# Patient Record
Sex: Male | Born: 1950
Health system: Southern US, Community
[De-identification: ages and names within clinical notes are randomized; demographics above are authoritative.]

## PROBLEM LIST (undated history)

## (undated) DIAGNOSIS — I10 Essential (primary) hypertension: Secondary | ICD-10-CM

## (undated) DIAGNOSIS — K635 Polyp of colon: Secondary | ICD-10-CM

## (undated) DIAGNOSIS — E119 Type 2 diabetes mellitus without complications: Secondary | ICD-10-CM

## (undated) DIAGNOSIS — I2510 Atherosclerotic heart disease of native coronary artery without angina pectoris: Secondary | ICD-10-CM

## (undated) DIAGNOSIS — I219 Acute myocardial infarction, unspecified: Secondary | ICD-10-CM

## (undated) DIAGNOSIS — N200 Calculus of kidney: Secondary | ICD-10-CM

## (undated) DIAGNOSIS — R6882 Decreased libido: Secondary | ICD-10-CM

## (undated) DIAGNOSIS — E291 Testicular hypofunction: Secondary | ICD-10-CM

## (undated) DIAGNOSIS — R1011 Right upper quadrant pain: Secondary | ICD-10-CM

## (undated) DIAGNOSIS — K5790 Diverticulosis of intestine, part unspecified, without perforation or abscess without bleeding: Secondary | ICD-10-CM

## (undated) DIAGNOSIS — N529 Male erectile dysfunction, unspecified: Secondary | ICD-10-CM

## (undated) DIAGNOSIS — Z87442 Personal history of urinary calculi: Secondary | ICD-10-CM

## (undated) DIAGNOSIS — R972 Elevated prostate specific antigen [PSA]: Secondary | ICD-10-CM

## (undated) HISTORY — DX: Diverticulosis of intestine, part unspecified, without perforation or abscess without bleeding: K57.90

## (undated) HISTORY — DX: Calculus of kidney: N20.0

## (undated) HISTORY — PX: APPENDECTOMY: SHX54

## (undated) HISTORY — DX: Polyp of colon: K63.5

## (undated) HISTORY — DX: Right upper quadrant pain: R10.11

## (undated) HISTORY — DX: Personal history of urinary calculi: Z87.442

## (undated) HISTORY — DX: Acute myocardial infarction, unspecified: I21.9

## (undated) HISTORY — DX: Elevated prostate specific antigen (PSA): R97.20

## (undated) HISTORY — PX: CYSTOSCOPY W/ URETEROSCOPY: SUR379

## (undated) HISTORY — PX: INGUINAL HERNIA REPAIR: SHX194

## (undated) HISTORY — DX: Decreased libido: R68.82

## (undated) HISTORY — DX: Male erectile dysfunction, unspecified: N52.9

## (undated) HISTORY — DX: Type 2 diabetes mellitus without complications: E11.9

## (undated) HISTORY — PX: CARDIAC CATHETERIZATION: SHX172

## (undated) HISTORY — DX: Testicular hypofunction: E29.1

## (undated) HISTORY — DX: Essential (primary) hypertension: I10

## (undated) HISTORY — PX: TONSILLECTOMY: SHX5217

---

## 1999-01-14 ENCOUNTER — Ambulatory Visit (HOSPITAL_BASED_OUTPATIENT_CLINIC_OR_DEPARTMENT_OTHER): Admission: RE | Admit: 1999-01-14 | Discharge: 1999-01-14 | Payer: Self-pay | Admitting: Surgery

## 2005-05-24 ENCOUNTER — Emergency Department (HOSPITAL_COMMUNITY): Admission: EM | Admit: 2005-05-24 | Discharge: 2005-05-24 | Payer: Self-pay | Admitting: Emergency Medicine

## 2005-06-03 ENCOUNTER — Ambulatory Visit (HOSPITAL_COMMUNITY): Admission: RE | Admit: 2005-06-03 | Discharge: 2005-06-03 | Payer: Self-pay | Admitting: Urology

## 2005-08-08 DIAGNOSIS — N200 Calculus of kidney: Secondary | ICD-10-CM

## 2005-08-08 HISTORY — PX: KIDNEY STONE SURGERY: SHX686

## 2005-08-08 HISTORY — DX: Calculus of kidney: N20.0

## 2012-03-14 ENCOUNTER — Ambulatory Visit (INDEPENDENT_AMBULATORY_CARE_PROVIDER_SITE_OTHER): Payer: 59 | Admitting: Family Medicine

## 2012-03-14 ENCOUNTER — Encounter: Payer: Self-pay | Admitting: Family Medicine

## 2012-03-14 VITALS — BP 160/94 | HR 60 | Temp 98.3°F | Ht 71.0 in | Wt 180.0 lb

## 2012-03-14 DIAGNOSIS — R030 Elevated blood-pressure reading, without diagnosis of hypertension: Secondary | ICD-10-CM

## 2012-03-14 DIAGNOSIS — K5900 Constipation, unspecified: Secondary | ICD-10-CM

## 2012-03-14 DIAGNOSIS — F17200 Nicotine dependence, unspecified, uncomplicated: Secondary | ICD-10-CM | POA: Insufficient documentation

## 2012-03-14 DIAGNOSIS — R1031 Right lower quadrant pain: Secondary | ICD-10-CM | POA: Insufficient documentation

## 2012-03-14 DIAGNOSIS — F17210 Nicotine dependence, cigarettes, uncomplicated: Secondary | ICD-10-CM | POA: Insufficient documentation

## 2012-03-14 DIAGNOSIS — IMO0001 Reserved for inherently not codable concepts without codable children: Secondary | ICD-10-CM

## 2012-03-14 NOTE — Progress Notes (Signed)
Chief Complaint  Patient presents with  . Advice Only    stomach pains infrequently over the past year or so, pain becoming more and more frequent lately.    HPI:  He complains of pain in his right lower abdomen, described as a sharp, knife-like pain.  Sometimes is sharp enough to double him over, other times is dull.  Sometimes pain radiates around to the right back.  Pain is sometimes alleivated by holding pressure over the area. He had an MRI done by urologist which showed small stones in the right kidney, nothing in the ureter, and that pain isn't from the urinary tract.  Stones have always been on the right side, and in similar location, but this was r/o'd as etiology.  Sometimes he is tender to touch over the right lower stomach, even just with his wife putting her arm there during sleep.  He has started working out regularly since January, qod, for an hour. Has no pain with exercise or with position changes. He is not having any pain/tenderness today. Pain is worse when his bowels are full, better after having bowel movement. No change with passing gas.  Started having some nausea with the pain in the last month, like he is very hungry.  Pain can go away for a few weeks at a time.  Used to only occur every few months, but has increased in the frequency.  Last colonoscopy was about 9-10 years ago, around 2003. He recalls they weren't able to complete the colonoscopy due a turn in the bowel they couldn't navigate around.  Also recalls being awake and having discomfort.  Stools used to be regular, daily. Since getting remarried, diet has changed a lot, sometimes going 2-3 days between bowel movements.  Sometimes bowels are soft and normal, other times is constipated and straining. Denies any blood in stool.  Past Medical History  Diagnosis Date  . Elevated PSA     recently biopsied, seeing Dr. Margarita Grizzle at [facility]  . Kidney stones 2007   Past Surgical History  Procedure Date  . Appendectomy    . Kidney stone surgery age 53  . Kidney stone surgery 2007    ?laser, possibly lithotripsy  . Tonsillectomy age 29  . Inguinal hernia repair     RIH   History   Social History  . Marital Status: Married    Spouse Name: N/A    Number of Children: N/A  . Years of Education: N/A   Occupational History  . Tree surgeon    Social History Main Topics  . Smoking status: Current Everyday Smoker -- 1.0 packs/day    Types: Cigarettes  . Smokeless tobacco: Never Used  . Alcohol Use: Yes     1 drink per day.   . Drug Use: No  . Sexually Active: Not on file   Other Topics Concern  . Not on file   Social History Narrative   Recently re-married   Family History  Problem Relation Age of Onset  . Stroke Mother 33  . Heart disease Mother 39  . Cancer Father 35    prostate  . Heart disease Father 82  . Hyperlipidemia Father   . Stroke Sister   . Cancer Maternal Uncle     lung cancer  . Cancer Maternal Aunt     breast cancer in mid-40's  . Cancer Paternal Uncle     prostate cancer  . Cancer Cousin     prostate cancer   Current outpatient prescriptions:Cranberry  500 MG CAPS, Take 1 capsule by mouth daily., Disp: , Rfl: ;  Ginkgo Biloba 40 MG TABS, Take 1 tablet by mouth daily., Disp: , Rfl: ;  Multiple Vitamins-Minerals (MULTIVITAMIN WITH MINERALS) tablet, Take 1 tablet by mouth daily., Disp: , Rfl: ;  Nutritional Supplements (DHEA PO), Take 1 tablet by mouth daily., Disp: , Rfl:  vardenafil (LEVITRA) 20 MG tablet, Take 20 mg by mouth daily as needed., Disp: , Rfl:   No Known Allergies  ROS:  Denies fevers, weight changes, headaches, dizziness, URI symptoms, shortness of breath, chest pain.  Denies heartburn, vomiting.  Bowel changes as above.  Denies urinary complaints, joint pains, skin rashes or other concerns.  See HPI  PHYSICAL EXAM: BP 142/84  Pulse 60  Temp 98.3 F (36.8 C) (Oral)  Ht 5\' 11"  (1.803 m)  Wt 180 lb (81.647 kg)  BMI 25.10 kg/m2 160/94 on repeat by  MD RA Well developed, well-appearing male in no distress HEENT:  Conjunctiva clear, PERRL, OP normal Neck: no lymphadenopathy, thyromegaly or mass Heart: regular rate and rhythm without murmur Lungs: clear bilaterally Back: no spine, SI or CVA tenderness Abdomen: soft, normal bowel sounds. Area of tenderness of right lower abdomen, somewhat laterally. No mass, not really tender.  No hepatosplenomegaly, negative Murphy sign.  No hernias Extremities: no edema Skin: no rash Neuro: alert and oriented, normal gait, cranial nerves grossly intact  ASSESSMENT/PLAN:  1. RLQ abdominal pain  Ambulatory referral to Gastroenterology  2. Tobacco use disorder    3. Elevated BP    4. Constipation     Increase fluid intake, fiber intake, stool softener. Refer to GI for repeat colonoscopy.  Elevated BP--Check BP elsewhere.  Low sodium diet reviewed (he is good at home, but not when eating without wife).  Quit smoking--set a quit date. Risks of smoking reviewed and encouraged cessation.  Return if having worsening abdominal pain, associated with nausea, vomiting, blood in stools, fevers or other concerns.

## 2012-03-14 NOTE — Patient Instructions (Addendum)
Check your BP periodically elsewhere--goal is <135/85. Follow a low sodium diet.  If BP remains elevated, we night need to discuss medications.  For now, work on diet, exercise, and monitoring blood pressure.  2 Gram Low Sodium Diet A 2 gram sodium diet restricts the amount of sodium in the diet to no more than 2 g or 2000 mg daily. Limiting the amount of sodium is often used to help lower blood pressure. It is important if you have heart, liver, or kidney problems. Many foods contain sodium for flavor and sometimes as a preservative. When the amount of sodium in a diet needs to be low, it is important to know what to look for when choosing foods and drinks. The following includes some information and guidelines to help make it easier for you to adapt to a low sodium diet. QUICK TIPS  Do not add salt to food.   Avoid convenience items and fast food.   Choose unsalted snack foods.   Buy lower sodium products, often labeled as "lower sodium" or "no salt added."   Check food labels to learn how much sodium is in 1 serving.   When eating at a restaurant, ask that your food be prepared with less salt or none, if possible.  READING FOOD LABELS FOR SODIUM INFORMATION The nutrition facts label is a good place to find how much sodium is in foods. Look for products with no more than 500 to 600 mg of sodium per meal and no more than 150 mg per serving. Remember that 2 g = 2000 mg. The food label may also list foods as:  Sodium-free: Less than 5 mg in a serving.   Very low sodium: 35 mg or less in a serving.   Low-sodium: 140 mg or less in a serving.   Light in sodium: 50% less sodium in a serving. For example, if a food that usually has 300 mg of sodium is changed to become light in sodium, it will have 150 mg of sodium.   Reduced sodium: 25% less sodium in a serving. For example, if a food that usually has 400 mg of sodium is changed to reduced sodium, it will have 300 mg of sodium.  CHOOSING  FOODS Grains  Avoid: Salted crackers and snack items. Some cereals, including instant hot cereals. Bread stuffing and biscuit mixes. Seasoned rice or pasta mixes.   Choose: Unsalted snack items. Low-sodium cereals, oats, puffed wheat and rice, shredded wheat. English muffins and bread. Pasta.  Meats  Avoid: Salted, canned, smoked, spiced, pickled meats, including fish and poultry. Bacon, ham, sausage, cold cuts, hot dogs, anchovies.   Choose: Low-sodium canned tuna and salmon. Fresh or frozen meat, poultry, and fish.  Dairy  Avoid: Processed cheese and spreads. Cottage cheese. Buttermilk and condensed milk. Regular cheese.   Choose: Milk. Low-sodium cottage cheese. Yogurt. Sour cream. Low-sodium cheese.  Fruits and Vegetables  Avoid: Regular canned vegetables. Regular canned tomato sauce and paste. Frozen vegetables in sauces. Olives. Rosita Fire. Relishes. Sauerkraut.   Choose: Low-sodium canned vegetables. Low-sodium tomato sauce and paste. Frozen or fresh vegetables. Fresh and frozen fruit.  Condiments  Avoid: Canned and packaged gravies. Worcestershire sauce. Tartar sauce. Barbecue sauce. Soy sauce. Steak sauce. Ketchup. Onion, garlic, and table salt. Meat flavorings and tenderizers.   Choose: Fresh and dried herbs and spices. Low-sodium varieties of mustard and ketchup. Lemon juice. Tabasco sauce. Horseradish.  SAMPLE 2 GRAM SODIUM MEAL PLAN Breakfast / Sodium (mg)  1 cup low-fat milk /  143 mg   2 slices whole-wheat toast / 270 mg   1 tbs heart-healthy margarine / 153 mg   1 hard-boiled egg / 139 mg   1 small orange / 0 mg  Lunch / Sodium (mg)  1 cup raw carrots / 76 mg    cup hummus / 298 mg   1 cup low-fat milk / 143 mg    cup red grapes / 2 mg   1 whole-wheat pita bread / 356 mg  Dinner / Sodium (mg)  1 cup whole-wheat pasta / 2 mg   1 cup low-sodium tomato sauce / 73 mg   3 oz lean ground beef / 57 mg   1 small side salad (1 cup raw spinach leaves,   cup cucumber,  cup yellow bell pepper) with 1 tsp olive oil and 1 tsp red wine vinegar / 25 mg  Snack / Sodium (mg)  1 container low-fat vanilla yogurt / 107 mg   3 graham cracker squares / 127 mg  Nutrient Analysis  Calories: 2033   Protein: 77 g   Carbohydrate: 282 g   Fat: 72 g   Sodium: 1971 mg  Document Released: 07/25/2005 Document Revised: 07/14/2011 Document Reviewed: 10/26/2009 [facility] Patient Information 2012 [place], [place].  Please QUIT SMOKING!  Increase fluid and fiber intake.  You may take a stool softener such as Colace on a daily basis to help keep stools soft--you can back off on taking this if they get too soft/loose.  We are referring you to GI for colonoscopy--please call us if you don't hear about an appointment with them in the next 2 weeks.

## 2012-03-15 ENCOUNTER — Encounter: Payer: Self-pay | Admitting: Gastroenterology

## 2012-04-10 ENCOUNTER — Encounter: Payer: Self-pay | Admitting: Gastroenterology

## 2012-04-10 ENCOUNTER — Ambulatory Visit (INDEPENDENT_AMBULATORY_CARE_PROVIDER_SITE_OTHER): Payer: 59 | Admitting: Gastroenterology

## 2012-04-10 ENCOUNTER — Other Ambulatory Visit (INDEPENDENT_AMBULATORY_CARE_PROVIDER_SITE_OTHER): Payer: 59

## 2012-04-10 VITALS — BP 154/92 | HR 68 | Ht 71.0 in | Wt 179.0 lb

## 2012-04-10 DIAGNOSIS — R1011 Right upper quadrant pain: Secondary | ICD-10-CM

## 2012-04-10 DIAGNOSIS — Z1211 Encounter for screening for malignant neoplasm of colon: Secondary | ICD-10-CM

## 2012-04-10 DIAGNOSIS — N200 Calculus of kidney: Secondary | ICD-10-CM

## 2012-04-10 LAB — VITAMIN B12: Vitamin B-12: 326 pg/mL (ref 211–911)

## 2012-04-10 LAB — FERRITIN: Ferritin: 272.9 ng/mL (ref 22.0–322.0)

## 2012-04-10 LAB — BASIC METABOLIC PANEL
Calcium: 9.1 mg/dL (ref 8.4–10.5)
Chloride: 104 mEq/L (ref 96–112)
Creatinine, Ser: 0.9 mg/dL (ref 0.4–1.5)
GFR: 89.29 mL/min (ref >60.00)
Glucose, Bld: 108 mg/dL — ABNORMAL HIGH (ref 70–99)
Potassium: 4.4 mEq/L (ref 3.5–5.1)
Sodium: 139 mEq/L (ref 135–145)

## 2012-04-10 LAB — HEPATIC FUNCTION PANEL
AST: 16 U/L (ref 0–37)
Albumin: 4 g/dL (ref 3.5–5.2)
Alkaline Phosphatase: 47 U/L (ref 39–117)
Bilirubin, Direct: 0.1 mg/dL (ref 0.0–0.3)
Total Bilirubin: 0.8 mg/dL (ref 0.3–1.2)

## 2012-04-10 LAB — CBC WITH DIFFERENTIAL/PLATELET
Basophils Absolute: 0.1 10*3/uL (ref 0.0–0.1)
Basophils Relative: 1.2 % (ref 0.0–3.0)
Eosinophils Absolute: 0.4 10*3/uL (ref 0.0–0.7)
HCT: 47.3 % (ref 39.0–52.0)
Hemoglobin: 15.9 g/dL (ref 13.0–17.0)
Lymphocytes Relative: 25.1 % (ref 12.0–46.0)
Lymphs Abs: 1.7 10*3/uL (ref 0.7–4.0)
MCHC: 33.7 g/dL (ref 30.0–36.0)
MCV: 93.4 fl (ref 78.0–100.0)
Monocytes Absolute: 0.6 10*3/uL (ref 0.1–1.0)
Monocytes Relative: 8.5 % (ref 3.0–12.0)
Neutro Abs: 3.9 10*3/uL (ref 1.4–7.7)
Neutrophils Relative %: 59.6 % (ref 43.0–77.0)
Platelets: 155 10*3/uL (ref 150.0–400.0)
RDW: 12.6 % (ref 11.5–14.6)
WBC: 6.6 10*3/uL (ref 4.5–10.5)

## 2012-04-10 LAB — IBC PANEL
Iron: 144 ug/dL (ref 42–165)
Saturation Ratios: 50 % (ref 20.0–50.0)
Transferrin: 205.8 mg/dL — ABNORMAL LOW (ref 212.0–360.0)

## 2012-04-10 LAB — TSH: TSH: 1.48 u[IU]/mL (ref 0.35–5.50)

## 2012-04-10 MED ORDER — HYOSCYAMINE SULFATE 0.125 MG SL SUBL: 0.1250 mg | SUBLINGUAL_TABLET | SUBLINGUAL | Status: DC | PRN

## 2012-04-10 MED ORDER — PEG-KCL-NACL-NASULF-NA ASC-C 100 G PO SOLR: 1.0000 | Freq: Once | ORAL | Status: DC

## 2012-04-10 NOTE — Patient Instructions (Addendum)
You have been scheduled for a colonoscopy with propofol. Please follow written instructions given to you at your visit today.  Please pick up your prep kit at the pharmacy within the next 1-3 days. If you use inhalers (even only as needed), please bring them with you on the day of your procedure. Your physician has requested that you go to the basement for the following lab work before leaving today: We have sent the following medications to your pharmacy for you to pick up at your convenience: You have been scheduled for an abdominal ultrasound at [facility] (1st floor of hospital) on 04-12-12 at 8:30am. Please arrive 15 minutes prior to your appointment for registration. Make certain not to have anything to eat or drink 6 hours prior to your appointment. Should you need to reschedule your appointment, please contact radiology at [phone].  CC:Eve Lynelle Doctor, M.D.

## 2012-04-10 NOTE — Progress Notes (Signed)
History of Present Illness:  This is a 61 year old Caucasian male referred by Dr.Knapp for evaluation of several months of rather persistent dull right upper quadrant abdominal pain with occasional sharp stabbing pain relieved by pressure and rest. He recently has had mild nausea but no emesis. The patient denies any hepatobiliary complaints or any history of hepatitis or pancreatitis, but he is a long-term steady user of cigarettes and alcohol. Past history is remarkable for multiple episodes of kidney stones, but apparently recent CT scan at his urologist showed no evidence of right sciatic kidney stones. Patient has fairly regular bowel movements and denies melena or hematochezia, or any history of anemia. There is a history of gallstones and multiple family members. The patient had a flexible sigmoidoscopy 10 years ago, but otherwise has not had endoscopic exams. He denies a specific food intolerances, and has recently voluntarily lost 5 the 10 pounds. He specific denies dyspepsia, reflux symptoms, dysphagia, or other upper GI problems. It is of note that he does use when necessary Advil rather regularly.  I have reviewed this patient's present history, medical and surgical past history, allergies and medications.     ROS: The remainder of the 10 point ROS is negative... recent prostate biopsy apparently was unremarkable. No history of current cardiovascular, pulmonary, or other urologic problems.     Physical Exam: Blood pressure 154/92, pulse 60 and regular, weight 179 pounds with a BMI of 24.97. 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 General well developed well nourished patient in no acute distress, appearing his stated age Eyes PERRLA, no icterus, fundoscopic exam per opthamologist Skin no lesions noted Neck supple, no adenopathy, no thyroid enlargement, no tenderness Chest clear to percussion and auscultation Heart no significant murmurs, gallops or rubs noted Abdomen no  hepatosplenomegaly masses or tenderness, BS normal.  Rectal inspection normal no fissures, or fistulae noted.  No masses or tenderness on digital exam. Stool guaiac negative. Extremities no acute joint lesions, edema, phlebitis or evidence of cellulitis. Neurologic patient oriented x 3, cranial nerves intact, no focal neurologic deficits noted. Psychological mental status normal and normal affect.  Assessment and plan: Atypical right upper quadrant pain in a 61 year old patient with a strong family history of cholelithiasis. Patient also has a history of chronic ethanol use, but I cannot appreciate hepatosplenomegaly. I have ordered liver function test for review, upper nominal ultrasound exam to exclude cholelithiasis, and also will perform colonoscopy because of his age. His pain may be musculoskeletal versus organic hepatobiliary disease.   Copy Dr.Knapp   No diagnosis found.

## 2012-04-11 ENCOUNTER — Other Ambulatory Visit: Payer: Self-pay | Admitting: Gastroenterology

## 2012-04-11 LAB — FOLATE: Folate: 11.1 ng/mL (ref >5.9)

## 2012-04-12 ENCOUNTER — Ambulatory Visit (HOSPITAL_COMMUNITY)
Admission: RE | Admit: 2012-04-12 | Discharge: 2012-04-12 | Disposition: A | Discharge status: Home / Self Care | Payer: 59 | Source: Ambulatory Visit | Attending: Gastroenterology | Admitting: Gastroenterology

## 2012-04-12 DIAGNOSIS — K824 Cholesterolosis of gallbladder: Secondary | ICD-10-CM | POA: Insufficient documentation

## 2012-04-12 DIAGNOSIS — R1011 Right upper quadrant pain: Secondary | ICD-10-CM

## 2012-04-13 ENCOUNTER — Other Ambulatory Visit: Payer: 59

## 2012-04-18 LAB — HEMOCHROMATOSIS DNA-PCR(C282Y,H63D): DNA Mutation Analysis: NOT DETECTED

## 2012-04-20 ENCOUNTER — Encounter: Payer: Self-pay | Admitting: Gastroenterology

## 2012-04-20 ENCOUNTER — Ambulatory Visit (AMBULATORY_SURGERY_CENTER): Payer: 59 | Admitting: Gastroenterology

## 2012-04-20 VITALS — BP 143/86 | HR 76 | Temp 98.2°F | Resp 33 | Ht 71.0 in | Wt 179.0 lb

## 2012-04-20 DIAGNOSIS — K5900 Constipation, unspecified: Secondary | ICD-10-CM

## 2012-04-20 DIAGNOSIS — D126 Benign neoplasm of colon, unspecified: Secondary | ICD-10-CM

## 2012-04-20 DIAGNOSIS — K5730 Diverticulosis of large intestine without perforation or abscess without bleeding: Secondary | ICD-10-CM

## 2012-04-20 DIAGNOSIS — R1011 Right upper quadrant pain: Secondary | ICD-10-CM

## 2012-04-20 LAB — HM COLONOSCOPY

## 2012-04-20 MED ORDER — SODIUM CHLORIDE 0.9 % IV SOLN: 500.0000 mL | INTRAVENOUS | Status: DC

## 2012-04-20 NOTE — Op Note (Signed)
[facility] [address] [place], [address]   COLONOSCOPY PROCEDURE REPORT  PATIENT: Adrian Baird, Adrian Baird  MR#: [id number] BIRTHDATE: [birth year] , 61  yrs. old GENDER: Male ENDOSCOPIST: Mardella Layman, MD, Clementeen Graham REFERRED BY:  Joselyn Arrow, M.D.  Devoria Albe, M.D. PROCEDURE DATE:  04/20/2012 PROCEDURE:   Colonoscopy, screening ASA CLASS:   Class II INDICATIONS:average risk patient for colon cancer. MEDICATIONS: propofol (Diprivan) 250mg  IV  DESCRIPTION OF PROCEDURE:   After the risks and benefits and of the procedure were explained, informed consent was obtained.  A digital rectal exam revealed no abnormalities of the rectum.    The LB [id number]  endoscope was introduced through the anus and advanced to the cecum, which was identified by both the appendix and ileocecal valve .  The quality of the prep was excellent, using MoviPrep .  The instrument was then slowly withdrawn as the colon was fully examined.     COLON FINDINGS: Mild diverticulosis was noted in the sigmoid colon. A diminutive smooth sessile polyp was found at the splenic flexure.  A polypectomy was performed with a cold snare.  The resection was complete and the polyp tissue was not retrieved.   A smooth sessile polyp ranging between 3-54mm in size was found in the rectum.  A polypectomy was performed using hot forceps.  The resection was complete and the polyp tissue was completely retrieved.     Retroflexed views revealed no abnormalities.     The scope was then withdrawn from the patient and the procedure completed.  COMPLICATIONS: There were no complications. ENDOSCOPIC IMPRESSION: 1.   Mild diverticulosis was noted in the sigmoid colon 2.   Diminutive sessile polyp was found at the splenic flexure; polypectomy was performed with a cold snare 3.   Sessile polyp ranging between 3-58mm in size was found in the rectum; polypectomy was performed using hot forceps  RECOMMENDATIONS: 1.   await pathology results 2.  Repeat colonoscopy in 5 years if polyp adenomatous; otherwise 10 years 3.  High fiber diet   REPEAT EXAM:  cc:  _______________________________ eSignedMardella Layman, MD, [facility] 04/20/2012 3:24 PM

## 2012-04-20 NOTE — Progress Notes (Signed)
Patient did not experience any of the following events: a burn prior to discharge; a fall within the facility; wrong site/side/patient/procedure/implant event; or a hospital transfer or hospital admission upon discharge from the facility. (G8907) Patient did not have preoperative order for IV antibiotic SSI prophylaxis. (G8918)  

## 2012-04-20 NOTE — Patient Instructions (Addendum)

## 2012-04-23 ENCOUNTER — Telehealth: Payer: Self-pay

## 2012-04-23 NOTE — Telephone Encounter (Signed)
  Follow up Call-  Call back number 04/20/2012  Post procedure Call Back phone  # [phone]  Permission to leave phone message Yes     Patient questions:  Do you have a fever, pain , or abdominal swelling? no Pain Score  0 *  Have you tolerated food without any problems? yes  Have you been able to return to your normal activities? yes  Do you have any questions about your discharge instructions: Diet   no Medications  no Follow up visit  no  Do you have questions or concerns about your Care? no  Actions: * If pain score is 4 or above: No action needed, pain <4.

## 2012-04-25 ENCOUNTER — Encounter: Payer: Self-pay | Admitting: Gastroenterology

## 2012-05-17 ENCOUNTER — Encounter: Payer: Self-pay | Admitting: *Deleted

## 2012-10-24 ENCOUNTER — Ambulatory Visit (INDEPENDENT_AMBULATORY_CARE_PROVIDER_SITE_OTHER): Payer: 59 | Admitting: Family Medicine

## 2012-10-24 ENCOUNTER — Encounter: Payer: Self-pay | Admitting: Family Medicine

## 2012-10-24 VITALS — BP 142/90 | HR 88 | Ht 71.0 in | Wt 186.0 lb

## 2012-10-24 DIAGNOSIS — F17200 Nicotine dependence, unspecified, uncomplicated: Secondary | ICD-10-CM

## 2012-10-24 MED ORDER — VARENICLINE TARTRATE 0.5 MG X 11 & 1 MG X 42 PO MISC: ORAL | Status: DC

## 2012-10-24 MED ORDER — VARENICLINE TARTRATE 1 MG PO TABS: 1.0000 mg | ORAL_TABLET | Freq: Two times a day (BID) | ORAL | Status: DC

## 2012-10-24 NOTE — Patient Instructions (Signed)
Smoking Cessation Quitting smoking is important to your health and has many advantages. However, it is not always easy to quit since nicotine is a very addictive drug. Often times, people try 3 times or more before being able to quit. This document explains the best ways for you to prepare to quit smoking. Quitting takes hard work and a lot of effort, but you can do it. ADVANTAGES OF QUITTING SMOKING  You will live longer, feel better, and live better.  Your body will feel the impact of quitting smoking almost immediately.  Within 20 minutes, blood pressure decreases. Your pulse returns to its normal level.  After 8 hours, carbon monoxide levels in the blood return to normal. Your oxygen level increases.  After 24 hours, the chance of having a heart attack starts to decrease. Your breath, hair, and body stop smelling like smoke.  After 48 hours, damaged nerve endings begin to recover. Your sense of taste and smell improve.  After 72 hours, the body is virtually free of nicotine. Your bronchial tubes relax and breathing becomes easier.  After 2 to 12 weeks, lungs can hold more air. Exercise becomes easier and circulation improves.  The risk of having a heart attack, stroke, cancer, or lung disease is greatly reduced.  After 1 year, the risk of coronary heart disease is cut in half.  After 5 years, the risk of stroke falls to the same as a nonsmoker.  After 10 years, the risk of lung cancer is cut in half and the risk of other cancers decreases significantly.  After 15 years, the risk of coronary heart disease drops, usually to the level of a nonsmoker.  If you are pregnant, quitting smoking will improve your chances of having a healthy baby.  The people you live with, especially any children, will be healthier.  You will have extra money to spend on things other than cigarettes. QUESTIONS TO THINK ABOUT BEFORE ATTEMPTING TO QUIT You may want to talk about your answers with your  caregiver.  Why do you want to quit?  If you tried to quit in the past, what helped and what did not?  What will be the most difficult situations for you after you quit? How will you plan to handle them?  Who can help you through the tough times? Your family? Friends? A caregiver?  What pleasures do you get from smoking? What ways can you still get pleasure if you quit? Here are some questions to ask your caregiver:  How can you help me to be successful at quitting?  What medicine do you think would be best for me and how should I take it?  What should I do if I need more help?  What is smoking withdrawal like? How can I get information on withdrawal? GET READY  Set a quit date.  Change your environment by getting rid of all cigarettes, ashtrays, matches, and lighters in your home, car, or work. Do not let people smoke in your home.  Review your past attempts to quit. Think about what worked and what did not. GET SUPPORT AND ENCOURAGEMENT You have a better chance of being successful if you have help. You can get support in many ways.  Tell your family, friends, and co-workers that you are going to quit and need their support. Ask them not to smoke around you.  Get individual, group, or telephone counseling and support. Programs are available at local hospitals and health centers. Call your local health department for   information about programs in your area.  Spiritual beliefs and practices may help some smokers quit.  Download a "quit meter" on your computer to keep track of quit statistics, such as how long you have gone without smoking, cigarettes not smoked, and money saved.  Get a self-help book about quitting smoking and staying off of tobacco. LEARN NEW SKILLS AND BEHAVIORS  Distract yourself from urges to smoke. Talk to someone, go for a walk, or occupy your time with a task.  Change your normal routine. Take a different route to work. Drink tea instead of coffee.  Eat breakfast in a different place.  Reduce your stress. Take a hot bath, exercise, or read a book.  Plan something enjoyable to do every day. Reward yourself for not smoking.  Explore interactive web-based programs that specialize in helping you quit. GET MEDICINE AND USE IT CORRECTLY Medicines can help you stop smoking and decrease the urge to smoke. Combining medicine with the above behavioral methods and support can greatly increase your chances of successfully quitting smoking.  Nicotine replacement therapy helps deliver nicotine to your body without the negative effects and risks of smoking. Nicotine replacement therapy includes nicotine gum, lozenges, inhalers, nasal sprays, and skin patches. Some may be available over-the-counter and others require a prescription.  Antidepressant medicine helps people abstain from smoking, but how this works is unknown. This medicine is available by prescription.  Nicotinic receptor partial agonist medicine simulates the effect of nicotine in your brain. This medicine is available by prescription. Ask your caregiver for advice about which medicines to use and how to use them based on your health history. Your caregiver will tell you what side effects to look out for if you choose to be on a medicine or therapy. Carefully read the information on the package. Do not use any other product containing nicotine while using a nicotine replacement product.  RELAPSE OR DIFFICULT SITUATIONS Most relapses occur within the first 3 months after quitting. Do not be discouraged if you start smoking again. Remember, most people try several times before finally quitting. You may have symptoms of withdrawal because your body is used to nicotine. You may crave cigarettes, be irritable, feel very hungry, cough often, get headaches, or have difficulty concentrating. The withdrawal symptoms are only temporary. They are strongest when you first quit, but they will go away within  10 14 days. To reduce the chances of relapse, try to:  Avoid drinking alcohol. Drinking lowers your chances of successfully quitting.  Reduce the amount of caffeine you consume. Once you quit smoking, the amount of caffeine in your body increases and can give you symptoms, such as a rapid heartbeat, sweating, and anxiety.  Avoid smokers because they can make you want to smoke.  Do not let weight gain distract you. Many smokers will gain weight when they quit, usually less than 10 pounds. Eat a healthy diet and stay active. You can always lose the weight gained after you quit.  Find ways to improve your mood other than smoking. FOR MORE INFORMATION  www.smokefree.gov  Document Released: 07/19/2001 Document Revised: 01/24/2012 Document Reviewed: 11/03/2011 [facility] Patient Information 2013 [place], [place].   Take the Chantix as prescribed.  Enroll in their support program. Stop if you develop significant side effects (nausea, or psychiatric, as we reviewed at visit). Start daily exercise routine. Keep healthy snacks around.

## 2012-10-24 NOTE — Progress Notes (Signed)
Chief Complaint  Patient presents with  . Nicotine Dependence    would like to use Chantix for smoking cessation. His wife used Chantix with good results.   Smoker:  He has quit for 5-10 years at a time in the past.  Last started smoking again about 2004.  Smoked 1PPD x 30 years total. In the past he quit cold [place] "just laid them down"--left pack on the counter for a year.  He has been unable to do that recently.  He has never tried Chantix in the past.  He got married in June. His wife restarted smoking after they married, and was able to quit with Chantix. He would like to try Chantix to aid in his efforts at smoking cessation.  Tried the e-cigarette--it seemed satisfactory, helped, but thought it was just a substitute, didn't like how the vapor felt in the lungs, so went back to cigarettes.  He never broke the hand-mouth habit.  He has been able to get down to 5/day, but not below that.  He finds that he really WANTS a cigarette with meals and coffee.  Past Medical History  Diagnosis Date  . Elevated PSA     recently biopsied, seeing Dr. Margarita Grizzle at [facility]  . Kidney stones 2007  . ED (erectile dysfunction)   . Hypogonadism male   . Decreased libido   . Personal history of urinary calculi   . Abdominal pain, left upper quadrant    Past Surgical History  Procedure Laterality Date  . Appendectomy    . Kidney stone surgery  2007    ?laser, possibly lithotripsy  . Kidney stone surgery  2007    ?laser, possibly lithotripsy  . Tonsillectomy  age 62  . Inguinal hernia repair      RI[facility]  . Cystoscopy w/ ureteroscopy      hx of   History   Social History  . Marital Status: Married    Spouse Name: N/A    Number of Children: 2  . Years of Education: N/A   Occupational History  . Tree surgeon    Social History Main Topics  . Smoking status: Current Every Day Smoker -- 1.00 packs/day    Types: Cigarettes  . Smokeless tobacco: Never Used     Comment: Trying to quit   .  Alcohol Use: 8.4 oz/week    7 Glasses of wine, 7 Cans of beer per week     Comment: 2 drink per day.   . Drug Use: No  . Sexually Active: Not on file   Other Topics Concern  . Not on file   Social History Narrative   Recently re-married   Daily caffeine    Current Outpatient Prescriptions on File Prior to Visit  Medication Sig Dispense Refill  . Cranberry 500 MG CAPS Take 1 capsule by mouth daily.      . Ginkgo Biloba 40 MG TABS Take 1 tablet by mouth daily.      . hyoscyamine (LEVSIN SL) 0.125 MG SL tablet Place 1 tablet (0.125 mg total) under the tongue every 4 (four) hours as needed for cramping.  30 tablet  11  . Multiple Vitamins-Minerals (MULTIVITAMIN WITH MINERALS) tablet Take 1 tablet by mouth daily.      . vardenafil (LEVITRA) 20 MG tablet Take 20 mg by mouth daily as needed.       No current facility-administered medications on file prior to visit.   No Known Allergies  ROS:  Denies fevers, URI symptoms.  Occasionally check BP's at home, also runs around 140/90.  Denies headaches, chest pain, dizziness. +ED.  Exercise has been limited some by his abdominal pain.  Needed to take levsin today after lunch for pain.  Overall doing a little better.  Had colonoscopy and saw Dr. Jarold Motto.  PHYSICAL EXAM: BP 142/90  Pulse 88  Ht 5\' 11"  (1.803 m)  Wt 186 lb (84.369 kg)  BMI 25.95 kg/m2 Well developed, pleasant male in no distress. Normal mood, affect, hygiene and grooming  ASSESSMENT/PLAN:  Tobacco use disorder - Plan: varenicline (CHANTIX STARTING MONTH PAK) 0.5 MG X 11 & 1 MG X 42 tablet, varenicline (CHANTIX CONTINUING MONTH PAK) 1 MG tablet  Counseled for 25-30 minutes regarding smoking cessation.  Reviewed risks of smoking, and which things may be reversible with quitting.  We discussed available counseling to help with his smoking cessation efforts.  He has gained weight when he quit in past--reviewed healthy snacks to have ready, as well as the recommendation to pair  with exercise (to give positive reinforcement about improvement in his breathing, and also prevent more weight gain).  Counseled extensively re: risks and side effects of Chantix, as well as other alternatives for smoking cessation (nicotine replacements, free counseling) in case he doesn't tolerate the medication.  Strongly encouraged to enroll in Chantix's support program.   F/u 4 months for CPE May need meds for blood pressure if not improved at CPE Low sodium diet, daily exercise

## 2013-02-15 ENCOUNTER — Encounter: Payer: Self-pay | Admitting: Internal Medicine

## 2013-02-27 ENCOUNTER — Encounter: Payer: Self-pay | Admitting: Family Medicine

## 2013-02-27 ENCOUNTER — Ambulatory Visit (INDEPENDENT_AMBULATORY_CARE_PROVIDER_SITE_OTHER): Payer: 59 | Admitting: Family Medicine

## 2013-02-27 ENCOUNTER — Telehealth: Payer: Self-pay | Admitting: *Deleted

## 2013-02-27 VITALS — BP 150/96 | HR 76 | Ht 71.0 in | Wt 191.0 lb

## 2013-02-27 DIAGNOSIS — Z23 Encounter for immunization: Secondary | ICD-10-CM

## 2013-02-27 DIAGNOSIS — R1031 Right lower quadrant pain: Secondary | ICD-10-CM

## 2013-02-27 DIAGNOSIS — Z1159 Encounter for screening for other viral diseases: Secondary | ICD-10-CM

## 2013-02-27 DIAGNOSIS — R030 Elevated blood-pressure reading, without diagnosis of hypertension: Secondary | ICD-10-CM

## 2013-02-27 DIAGNOSIS — Z13220 Encounter for screening for lipoid disorders: Secondary | ICD-10-CM

## 2013-02-27 DIAGNOSIS — Z0000 Encounter for general adult medical examination without abnormal findings: Secondary | ICD-10-CM

## 2013-02-27 DIAGNOSIS — IMO0001 Reserved for inherently not codable concepts without codable children: Secondary | ICD-10-CM | POA: Insufficient documentation

## 2013-02-27 LAB — POCT URINALYSIS DIPSTICK
Bilirubin, UA: NEGATIVE
Ketones, UA: NEGATIVE
Leukocytes, UA: NEGATIVE
Protein, UA: NEGATIVE

## 2013-02-27 NOTE — Telephone Encounter (Signed)
When I went in to give patient Tdap he had asked if you had ordered a CXR on him, stated that he briefly spoke with you about this. I told him I would send you a message and ask.

## 2013-02-27 NOTE — Telephone Encounter (Signed)
Patient states that he was just wanting to have one because he recently stopped smoking. He is not having any symptoms.

## 2013-02-27 NOTE — Telephone Encounter (Signed)
We did not discuss CXR at all during visit.  (maybe at a previous visit?)  They are not recommended for any routine screening for cancer. He wasn't having any symptoms

## 2013-02-27 NOTE — Progress Notes (Signed)
Chief Complaint  Patient presents with  . Annual Exam    fasting annual exam. Did want you to know that he is still having pain in his right side.     Adrian Baird is a 62 y.o. male who presents for a complete physical.  He has the following concerns:  Continues to have pain in R abdomen (mainly upper).  We spoke of this at his visit in 04/2012.  He has seen GI in interim.  He reports having had u/s.  He was told he had a "bend" in his colon which may be contributing, and to use metamucil.  Bowels are regular since using metamucil (previously had constipation), but pain unchanged.  He was also given rx for antispasmodic medication to use as needed, which is helpful. Needs it 3-5x/week.  Had some discomfort at 9am today, on empty stomach, after having had BM.  Sometimes he can feel a quivering in his upper R abdomen when lying at night.  Some stress related to marriage, and related to job changes, now a Production designer, theatre/television/film.  Sometimes has "bad days" where he feels down, related to stress. Denies significant depression, thoughts of hurting self, frequent crying.  Elevated BP's--Has been checking BP's elsewhere, and never has it been <140/90.  Typically running 148/92.  This has been since he stopped smoking, had been even higher while still smoking.  Eats some fast food ([employer]), and has french fries. Had [place] yesterday, +chips.  His wife cooks healthy at home, but that is only 1 meal/day  He quit smoking with the use of Chantix--took it for 2.5 months. Doing very well, even in situations where others smoke around him (ie golfing)  He sees urology yearly, last seen June. Had blood, urine, GU and rectal exam.  There is no immunization history on file for this patient. Gets flu shots through work He denies ever having had chicken pox, although had exposure as a child Last Td/TdaP unknown, likely >10 years Last colonoscopy: 04/2012 Dr. Jarold Motto Last PSA: 01/2013 by urologist Dentist: yearly Ophtho: every  year Exercise:  Golf weekly.  Hasn't otherwise been getting exercise regularly recently.  Has a gym at work  Past Medical History  Diagnosis Date  . Elevated PSA     recently biopsied, seeing Dr. Margarita Grizzle at [facility]  . Kidney stones 2007  . ED (erectile dysfunction)   . Hypogonadism male   . Decreased libido   . Personal history of urinary calculi   . RUQ abdominal pain     Past Surgical History  Procedure Laterality Date  . Appendectomy    . Kidney stone surgery  2007    ?laser, possibly lithotripsy  . Kidney stone surgery  2007    ?laser, possibly lithotripsy  . Tonsillectomy  age 89  . Inguinal hernia repair      RIH  . Cystoscopy w/ ureteroscopy      hx of    History   Social History  . Marital Status: Married    Spouse Name: N/A    Number of Children: 2  . Years of Education: N/A   Occupational History  . Tree surgeon, Production designer, theatre/television/film    Social History Main Topics  . Smoking status: Former Smoker -- 1.00 packs/day    Types: Cigarettes    Quit date: 11/02/2012  . Smokeless tobacco: Never Used     Comment: Trying to quit   . Alcohol Use: 8.4 oz/week    7 Glasses of wine, 7 Cans of beer  per week     Comment: 1-2 drinks per day  . Drug Use: No  . Sexually Active: Not on file   Other Topics Concern  . Not on file   Social History Narrative   re-married. 2 children--daughter in [facility], son in [place]    Family History  Problem Relation Age of Onset  . Stroke Mother 3  . Heart disease Mother 75  . Prostate cancer Father 65  . Heart disease Father 79  . Hyperlipidemia Father   . Stroke Sister   . Lung cancer Maternal Uncle   . Breast cancer Maternal Aunt     mid-40's  . Prostate cancer Paternal Uncle   . Prostate cancer Cousin   . Colon cancer Neg Hx     Current outpatient prescriptions:Ginkgo Biloba 40 MG TABS, Take 1 tablet by mouth daily., Disp: , Rfl: ;  hyoscyamine (LEVSIN SL) 0.125 MG SL tablet, Place 1 tablet (0.125 mg total) under the tongue every  4 (four) hours as needed for cramping., Disp: 30 tablet, Rfl: 11;  Multiple Vitamins-Minerals (MULTIVITAMIN WITH MINERALS) tablet, Take 1 tablet by mouth daily., Disp: , Rfl:  tadalafil (CIALIS) 5 MG tablet, Take 5 mg by mouth daily as needed for erectile dysfunction., Disp: , Rfl: ;  tamsulosin (FLOMAX) 0.4 MG CAPS, Take 0.4 mg by mouth daily. , Disp: , Rfl: ;  vardenafil (LEVITRA) 20 MG tablet, Take 20 mg by mouth daily as needed., Disp: , Rfl:   No Known Allergies  ROS:  The patient denies anorexia, fever, headaches,  vision loss, decreased hearing, ear pain, hoarseness, chest pain, palpitations, dizziness, syncope, dyspnea on exertion, cough, swelling, nausea, vomiting, diarrhea, constipation, melena, hematochezia, indigestion/heartburn, hematuria, incontinence, dysuria, genital lesions, joint pains, numbness, tingling, weakness, tremor, suspicious skin lesions, anxiety, abnormal bleeding/bruising, or enlarged lymph nodes 5# weight gain since he quit smoking. Weakened stream improved since on Flomax.  +ED, controlled with meds.   PHYSICAL EXAM:  BP 150/96  Pulse 76  Ht 5\' 11"  (1.803 m)  Wt 191 lb (86.637 kg)  BMI 26.65 kg/m2  General Appearance:    Alert, cooperative, no distress, appears stated age  Head:    Normocephalic, without obvious abnormality, atraumatic  Eyes:    PERRL, conjunctiva/corneas clear, EOM's intact, fundi    benign  Ears:    Normal TM's and external ear canals  Nose:   Nares normal, mucosa normal, no drainage or sinus   tenderness  Throat:   Lips, mucosa, and tongue normal; visualized gums normal; upper dentures and partial lowers  Neck:   Supple, no lymphadenopathy;  thyroid:  no   enlargement/tenderness/nodules; no carotid   bruit or JVD  Back:    Spine nontender, no curvature, ROM normal, no CVA     tenderness  Lungs:     Clear to auscultation bilaterally without wheezes, rales or     ronchi; respirations unlabored  Chest Wall:    No tenderness or deformity    Heart:    Regular rate and rhythm, S1 and S2 normal, no murmur, rub   or gallop  Breast Exam:    No chest wall tenderness, masses or gynecomastia  Abdomen:     Soft, non-tender, nondistended, normoactive bowel sounds,    no masses, no hepatosplenomegaly  Genitalia:    Deferred to urologist.  Rectal:    Deferred due to urologist.  Extremities:   No clubbing, cyanosis or edema  Pulses:   2+ and symmetric all extremities  Skin:  Skin color, texture, turgor normal, no rashes or lesions  Lymph nodes:   Cervical, supraclavicular, and axillary nodes normal  Neurologic:   CNII-XII intact, normal strength, sensation and gait; reflexes 2+ and symmetric throughout          Psych:   Normal mood, affect, hygiene and grooming.    ASSESSMENT/PLAN: Routine general medical examination at a health care facility - Plan: Visual acuity screening, POCT Urinalysis Dipstick, Comprehensive metabolic panel, Lipid panel  Need for Tdap vaccination - Plan: Tdap vaccine greater than or equal to 7yo IM  RLQ abdominal pain  Elevated BP - Plan: Comprehensive metabolic panel  Screening for lipoid disorders - Plan: Lipid panel  Screening for viral disease - Plan: Varicella zoster antibody, IgG  Discussed exercising at least 30 minutes of aerobic activity at least 5 days/week; proper sunscreen use reviewed; healthy diet and alcohol recommendations (less than or equal to 2 drinks/day) reviewed; regular seatbelt use; changing batteries in smoke detectors.  Immunization recommendations discussed--see below.  Colonoscopy recommendations reviewed, UTD  If +varicella IgG, can return in a month for zostavax (if covered, desired).  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  Can call here for refills when needed, continue prn.  Elevated BP's.  Low sodium diet reviewed, daily exercise  encouraged.  Continue to monitor BP's.  If still >140/90 in 3 months, then return for visit, and bring list of BP's

## 2013-02-27 NOTE — Telephone Encounter (Signed)
Patient informed. 

## 2013-02-27 NOTE — Patient Instructions (Addendum)
HEALTH MAINTENANCE RECOMMENDATIONS:  It is recommended that you get at least 30 minutes of aerobic exercise at least 5 days/week (for weight loss, you may need as much as 60-90 minutes). This can be any activity that gets your heart rate up. This can be divided in 10-15 minute intervals if needed, but try and build up your endurance at least once a week.  Weight bearing exercise is also recommended twice weekly.  Eat a healthy diet with lots of vegetables, fruits and fiber.  "Colorful" foods have a lot of vitamins (ie green vegetables, tomatoes, red peppers, etc).  Limit sweet tea, regular sodas and alcoholic beverages, all of which has a lot of calories and sugar.  Up to 2 alcoholic drinks daily may be beneficial for men (unless trying to lose weight, watch sugars).  Drink a lot of water.  Sunscreen of at least SPF 30 should be used on all sun-exposed parts of the skin when outside between the hours of 10 am and 4 pm (not just when at beach or pool, but even with exercise, golf, tennis, and yard work!)  Use a sunscreen that says "broad spectrum" so it covers both UVA and UVB rays, and make sure to reapply every 1-2 hours.  Remember to change the batteries in your smoke detectors when changing your clock times in the spring and fall.  Use your seat belt every time you are in a car, and please drive safely and not be distracted with cell phones and texting while driving.  Low sodium diet, daily exercise.  Continue to monitor BP's.  If still >140/90 in 3 months, then return for visit, and bring list of BP's  2 Gram Low Sodium Diet A 2 gram sodium diet restricts the amount of sodium in the diet to no more than 2 g or 2000 mg daily. Limiting the amount of sodium is often used to help lower blood pressure. It is important if you have heart, liver, or kidney problems. Many foods contain sodium for flavor and sometimes as a preservative. When the amount of sodium in a diet needs to be low, it is important to  know what to look for when choosing foods and drinks. The following includes some information and guidelines to help make it easier for you to adapt to a low sodium diet. QUICK TIPS  Do not add salt to food.  Avoid convenience items and fast food.  Choose unsalted snack foods.  Buy lower sodium products, often labeled as "lower sodium" or "no salt added."  Check food labels to learn how much sodium is in 1 serving.  When eating at a restaurant, ask that your food be prepared with less salt or none, if possible. READING FOOD LABELS FOR SODIUM INFORMATION The nutrition facts label is a good place to find how much sodium is in foods. Look for products with no more than 500 to 600 mg of sodium per meal and no more than 150 mg per serving. Remember that 2 g = 2000 mg. The food label may also list foods as:  Sodium-free: Less than 5 mg in a serving.  Very low sodium: 35 mg or less in a serving.  Low-sodium: 140 mg or less in a serving.  Light in sodium: 50% less sodium in a serving. For example, if a food that usually has 300 mg of sodium is changed to become light in sodium, it will have 150 mg of sodium.  Reduced sodium: 25% less sodium in a serving.  For example, if a food that usually has 400 mg of sodium is changed to reduced sodium, it will have 300 mg of sodium. CHOOSING FOODS Grains  Avoid: Salted crackers and snack items. Some cereals, including instant hot cereals. Bread stuffing and biscuit mixes. Seasoned rice or pasta mixes.  Choose: Unsalted snack items. Low-sodium cereals, oats, puffed wheat and rice, shredded wheat. English muffins and bread. Pasta. Meats  Avoid: Salted, canned, smoked, spiced, pickled meats, including fish and poultry. Bacon, ham, sausage, cold cuts, hot dogs, anchovies.  Choose: Low-sodium canned tuna and salmon. Fresh or frozen meat, poultry, and fish. Dairy  Avoid: Processed cheese and spreads. Cottage cheese. Buttermilk and condensed milk.  Regular cheese.  Choose: Milk. Low-sodium cottage cheese. Yogurt. Sour cream. Low-sodium cheese. Fruits and Vegetables  Avoid: Regular canned vegetables. Regular canned tomato sauce and paste. Frozen vegetables in sauces. Olives. Rosita Fire. Relishes. Sauerkraut.  Choose: Low-sodium canned vegetables. Low-sodium tomato sauce and paste. Frozen or fresh vegetables. Fresh and frozen fruit. Condiments  Avoid: Canned and packaged gravies. Worcestershire sauce. Tartar sauce. Barbecue sauce. Soy sauce. Steak sauce. Ketchup. Onion, garlic, and table salt. Meat flavorings and tenderizers.  Choose: Fresh and dried herbs and spices. Low-sodium varieties of mustard and ketchup. Lemon juice. Tabasco sauce. Horseradish. SAMPLE 2 GRAM SODIUM MEAL PLAN Breakfast / Sodium (mg)  1 cup low-fat milk / 143 mg  2 slices whole-wheat toast / 270 mg  1 tbs heart-healthy margarine / 153 mg  1 hard-boiled egg / 139 mg  1 small orange / 0 mg Lunch / Sodium (mg)  1 cup raw carrots / 76 mg   cup hummus / 298 mg  1 cup low-fat milk / 143 mg   cup red grapes / 2 mg  1 whole-wheat pita bread / 356 mg Dinner / Sodium (mg)  1 cup whole-wheat pasta / 2 mg  1 cup low-sodium tomato sauce / 73 mg  3 oz lean ground beef / 57 mg  1 small side salad (1 cup raw spinach leaves,  cup cucumber,  cup yellow bell pepper) with 1 tsp olive oil and 1 tsp red wine vinegar / 25 mg Snack / Sodium (mg)  1 container low-fat vanilla yogurt / 107 mg  3 graham cracker squares / 127 mg Nutrient Analysis  Calories: 2033  Protein: 77 g  Carbohydrate: 282 g  Fat: 72 g  Sodium: 1971 mg Document Released: 07/25/2005 Document Revised: 10/17/2011 Document Reviewed: 10/26/2009 Exit[employer] Patient Information 2014 [place], [place].  Check to see if shingles vaccine is covered

## 2013-02-27 NOTE — Telephone Encounter (Signed)
This is not indicated nor recommended.

## 2013-02-28 ENCOUNTER — Encounter: Payer: Self-pay | Admitting: Family Medicine

## 2013-02-28 LAB — COMPREHENSIVE METABOLIC PANEL
ALT: 32 U/L (ref 0–53)
AST: 22 U/L (ref 0–37)
Calcium: 9.6 mg/dL (ref 8.4–10.5)
Chloride: 102 mEq/L (ref 96–112)
Creat: 0.96 mg/dL (ref 0.50–1.35)
Total Bilirubin: 0.6 mg/dL (ref 0.3–1.2)

## 2013-02-28 LAB — LIPID PANEL
LDL Cholesterol: 101 mg/dL — ABNORMAL HIGH (ref 0–99)
Total CHOL/HDL Ratio: 2.9 Ratio
VLDL: 16 mg/dL (ref 0–40)

## 2013-02-28 LAB — VARICELLA ZOSTER ANTIBODY, IGG: Varicella IgG: 2772 Index — ABNORMAL HIGH (ref <135.00)

## 2013-03-04 ENCOUNTER — Telehealth: Payer: Self-pay | Admitting: *Deleted

## 2013-03-04 NOTE — Telephone Encounter (Signed)
Left message for patient informing that the chest ct that he is interested in possibly having done, screening for smoker is $299 and insurance will NOT pay for this screening.

## 2013-04-01 ENCOUNTER — Encounter: Payer: Self-pay | Admitting: Family Medicine

## 2013-06-13 ENCOUNTER — Other Ambulatory Visit: Payer: Self-pay

## 2013-07-15 ENCOUNTER — Other Ambulatory Visit: Payer: Self-pay | Admitting: Gastroenterology

## 2013-07-15 NOTE — Telephone Encounter (Signed)
PLEASE MAKE AN APPOINTMENT FOR FURTHER REFILLS  

## 2013-12-14 ENCOUNTER — Other Ambulatory Visit: Payer: Self-pay | Admitting: Gastroenterology

## 2013-12-16 ENCOUNTER — Other Ambulatory Visit: Payer: Self-pay | Admitting: Gastroenterology

## 2014-05-23 ENCOUNTER — Other Ambulatory Visit: Payer: Self-pay

## 2014-06-09 ENCOUNTER — Telehealth: Payer: Self-pay | Admitting: Gastroenterology

## 2014-06-09 NOTE — Telephone Encounter (Signed)
Spoke with patient's wife and he is having left sided abdominal pain. Episodes are increasing and his Hyoscyamine is not helping like it used too. Scheduled with Lori Hvozdovic, PA-C on 06/11/14 at 9:45 AM.

## 2014-06-11 ENCOUNTER — Other Ambulatory Visit (INDEPENDENT_AMBULATORY_CARE_PROVIDER_SITE_OTHER): Payer: 59

## 2014-06-11 ENCOUNTER — Ambulatory Visit: Payer: 59 | Admitting: Physician Assistant

## 2014-06-11 ENCOUNTER — Encounter: Payer: Self-pay | Admitting: *Deleted

## 2014-06-11 ENCOUNTER — Ambulatory Visit (INDEPENDENT_AMBULATORY_CARE_PROVIDER_SITE_OTHER): Payer: 59 | Admitting: Internal Medicine

## 2014-06-11 VITALS — BP 142/86 | HR 84

## 2014-06-11 DIAGNOSIS — R109 Unspecified abdominal pain: Secondary | ICD-10-CM

## 2014-06-11 DIAGNOSIS — K824 Cholesterolosis of gallbladder: Secondary | ICD-10-CM

## 2014-06-11 LAB — COMPREHENSIVE METABOLIC PANEL
ALT: 38 U/L (ref 0–53)
AST: 26 U/L (ref 0–37)
Albumin: 3.7 g/dL (ref 3.5–5.2)
Alkaline Phosphatase: 50 U/L (ref 39–117)
BUN: 16 mg/dL (ref 6–23)
CALCIUM: 9.3 mg/dL (ref 8.4–10.5)
CHLORIDE: 105 meq/L (ref 96–112)
CO2: 23 meq/L (ref 19–32)
CREATININE: 1 mg/dL (ref 0.4–1.5)
GFR: 80.52 mL/min (ref >60.00)
Glucose, Bld: 125 mg/dL — ABNORMAL HIGH (ref 70–99)
Potassium: 4.2 mEq/L (ref 3.5–5.1)
Sodium: 138 mEq/L (ref 135–145)
Total Bilirubin: 0.8 mg/dL (ref 0.2–1.2)
Total Protein: 7.1 g/dL (ref 6.0–8.3)

## 2014-06-11 LAB — CBC WITH DIFFERENTIAL/PLATELET
BASOS ABS: 0.1 10*3/uL (ref 0.0–0.1)
Basophils Relative: 1.8 % (ref 0.0–3.0)
EOS PCT: 5.2 % — AB (ref 0.0–5.0)
Eosinophils Absolute: 0.4 10*3/uL (ref 0.0–0.7)
HCT: 46.3 % (ref 39.0–52.0)
Hemoglobin: 15.5 g/dL (ref 13.0–17.0)
Lymphocytes Relative: 25.6 % (ref 12.0–46.0)
Lymphs Abs: 2.1 10*3/uL (ref 0.7–4.0)
MCHC: 33.4 g/dL (ref 30.0–36.0)
MCV: 92.7 fl (ref 78.0–100.0)
MONO ABS: 0.7 10*3/uL (ref 0.1–1.0)
Monocytes Relative: 8.3 % (ref 3.0–12.0)
NEUTROS PCT: 59.1 % (ref 43.0–77.0)
Neutro Abs: 4.8 10*3/uL (ref 1.4–7.7)
Platelets: 186 10*3/uL (ref 150.0–400.0)
RBC: 4.99 Mil/uL (ref 4.22–5.81)
RDW: 12.8 % (ref 11.5–15.5)
WBC: 8.1 10*3/uL (ref 4.0–10.5)

## 2014-06-11 LAB — URINALYSIS, ROUTINE W REFLEX MICROSCOPIC
Bilirubin Urine: NEGATIVE
HGB URINE DIPSTICK: NEGATIVE
Ketones, ur: NEGATIVE
LEUKOCYTES UA: NEGATIVE
NITRITE: NEGATIVE
Specific Gravity, Urine: 1.01 (ref 1.000–1.030)
TOTAL PROTEIN, URINE-UPE24: NEGATIVE
Urine Glucose: NEGATIVE
Urobilinogen, UA: 0.2 (ref 0.0–1.0)
pH: 7.5 (ref 5.0–8.0)

## 2014-06-11 LAB — LIPASE: LIPASE: 25 U/L (ref 11.0–59.0)

## 2014-06-11 LAB — AMYLASE: Amylase: 27 U/L (ref 27–131)

## 2014-06-11 MED ORDER — HYOSCYAMINE SULFATE ER 0.375 MG PO TB12: 0.3750 mg | ORAL_TABLET | ORAL | Status: DC

## 2014-06-11 NOTE — Progress Notes (Signed)
Adrian Baird [birth year] [id number]  Note: This dictation was prepared with Dragon digital system. Any transcriptional errors that result from this procedure are unintentional.   History of Present Illness:  This is a 63 year old white male, former patient of Dr. Sharlett Iles who has had at least 6 months history of right middle quadrant abdominal pain. He was evaluated for the same pain in 2013 but no definite cause was found. He takes Levsin sublingually 0.125 mg which seems to relieve the pain most of the time. He describes the pain as constant. It sometimes radiates to the back. It is not associated with physical activity. It has recently awakened him at night. It is not related to food or bowel movements. He had a colonoscopy in September 2013 with findings of mild diverticulosis and a hyperplastic polyp. He has a small gallbladder polyp on an ultrasound from September 2013. There is a strong family history of gallbladder disease in several direct relatives. He denies jaundice or fever. He has a history of alcohol abuse. He also has history of kidney stones. His weight has been stable. He has been trying to lose weight.    Past Medical History  Diagnosis Date  . Elevated PSA     recently biopsied, seeing Dr. Jasmine December at [facility]  . Kidney stones 2007  . ED (erectile dysfunction)   . Hypogonadism male   . Decreased libido   . Personal history of urinary calculi   . RUQ abdominal pain   . Diverticulosis   . Hyperplastic colon polyp     Past Surgical History  Procedure Laterality Date  . Appendectomy    . Kidney stone surgery  2007    ?laser, possibly lithotripsy  . Kidney stone surgery  2007    ?laser, possibly lithotripsy  . Tonsillectomy  age 74  . Inguinal hernia repair      RIH  . Cystoscopy w/ ureteroscopy      hx of    No Known Allergies  Family history and social history have been reviewed.  Review of Systems: negative for fever nausea vomiting weight loss  The  remainder of the 10 point ROS is negative except as outlined in the H&P  Physical Exam: General Appearance Well developed, in no distress Eyes  Non icteric  HEENT  Non traumatic, normocephalic  Mouth No lesion, tongue papillated, no cheilosis Neck Supple without adenopathy, thyroid not enlarged, no carotid bruits, no JVD Lungs Clear to auscultation bilaterally COR Normal S1, normal S2, regular rhythm, no murmur, quiet precordium Abdomen soft. Post appendectomy scar and post renal calculi scar in right lateral abdomen, tenderness in right middle and lower quadrant. No palpable mass. No obvious hernia. Straight take raising is negative. No CVA tenderness. Liver edge at costal margin. Rectal soft formed Hemoccult negative stool. Extremities  No pedal edema Skin No lesions Neurological Alert and oriented x 3 Psychological Normal mood and affect  Assessment and Plan:   Problem #53 63 year old white male with a recurrence of right-sided abdominal pain which seems to be relieved with antispasmodics. The pain has become more severe and is no longer responding to antispasmodics. He is up-to-date on his colonoscopy. He has a small gallbladder polyp last  ultrasound 2 years ago. We will proceed with a CT scan of the abdomen and pelvis. We need to rule out internal hernia, cecal volvulus or diverticulitis of the right colon. He will be given Levsin sublingually 0.125 mg as well as Levbid  0.375 mg every morning. And we  will check amylase, lipase, metabolic panel including liver function tests, and a CBC. He may need further evaluation such as a HIDA scan depending on the results and the diagnostic tests.    Delfin Edis 06/11/2014

## 2014-06-11 NOTE — Patient Instructions (Addendum)
We have sent the following medications to your pharmacy for you to pick up at your convenience: Levbid 0.375 every morning  You may take Levsin 2 tablets at a time.  Your physician has requested that you go to the basement for the following lab work before leaving today: CMET, Amylase, Lipase, urinalysis, CBC ---------------------------------------------------------------------------------------------------------------------------------------------------------------------------------- You have been scheduled for a CT scan of the abdomen and pelvis at [facility] ([address]---this is in the same building as Press photographer).   You are scheduled on 06/13/14 at 9:30 am. You should arrive 15 minutes prior to your appointment time for registration. Please follow the written instructions below on the day of your exam:  WARNING: IF YOU ARE ALLERGIC TO IODINE/X-RAY DYE, PLEASE NOTIFY RADIOLOGY IMMEDIATELY AT [phone]! YOU WILL BE GIVEN A 13 HOUR PREMEDICATION PREP.  1) Do not eat or drink anything after 5:30 am (4 hours prior to your test) 2) You have been given 2 bottles of oral contrast to drink. The solution may taste better if refrigerated, but do NOT add ice or any other liquid to this solution. Shake well before drinking.    Drink 1 bottle of contrast @ 7:30 am (2 hours prior to your exam)  Drink 1 bottle of contrast @ 8:30 am (1 hour prior to your exam)  You may take any medications as prescribed with a small amount of water except for the following: Metformin, Glucophage, Glucovance, Avandamet, Riomet, Fortamet, Actoplus Met, Janumet, Glumetza or Metaglip. The above medications must be held the day of the exam AND 48 hours after the exam.  The purpose of you drinking the oral contrast is to aid in the visualization of your intestinal tract. The contrast solution may cause some diarrhea. Before your exam is started, you will be given a small amount of fluid to drink.  Depending on your individual set of symptoms, you may also receive an intravenous injection of x-ray contrast/dye. Plan on being at [facility] for 30 minutes or long, depending on the type of exam you are having performed.  This test typically takes 30-45 minutes to complete.  If you have any questions regarding your exam or if you need to reschedule, you may call the CT department at [phone] between the hours of 8:00 am and 5:00 pm, Monday-Friday.  ________________________________________________________________________ CC:Dr Tommy Medal

## 2014-06-13 ENCOUNTER — Ambulatory Visit (INDEPENDENT_AMBULATORY_CARE_PROVIDER_SITE_OTHER)
Admission: RE | Admit: 2014-06-13 | Discharge: 2014-06-13 | Disposition: A | Discharge status: Home / Self Care | Payer: 59 | Source: Ambulatory Visit | Attending: Internal Medicine | Admitting: Internal Medicine

## 2014-06-13 DIAGNOSIS — R109 Unspecified abdominal pain: Secondary | ICD-10-CM

## 2014-06-13 MED ORDER — IOHEXOL 300 MG/ML  SOLN: 100.0000 mL | Freq: Once | INTRAMUSCULAR | Status: AC | PRN

## 2014-06-17 ENCOUNTER — Ambulatory Visit: Payer: 59 | Admitting: Internal Medicine

## 2014-08-22 ENCOUNTER — Other Ambulatory Visit: Payer: Self-pay | Admitting: Urology

## 2014-10-03 ENCOUNTER — Encounter (HOSPITAL_COMMUNITY): Payer: Self-pay

## 2014-10-09 ENCOUNTER — Encounter (HOSPITAL_COMMUNITY)
Admission: RE | Disposition: A | Discharge status: Home / Self Care | Payer: Self-pay | Source: Ambulatory Visit | Attending: Urology

## 2014-10-09 ENCOUNTER — Encounter (HOSPITAL_COMMUNITY): Payer: Self-pay | Admitting: *Deleted

## 2014-10-09 ENCOUNTER — Ambulatory Visit (HOSPITAL_COMMUNITY): Payer: 59

## 2014-10-09 ENCOUNTER — Ambulatory Visit (HOSPITAL_COMMUNITY)
Admission: RE | Admit: 2014-10-09 | Discharge: 2014-10-09 | Disposition: A | Discharge status: Home / Self Care | Payer: 59 | Source: Ambulatory Visit | Attending: Urology | Admitting: Urology

## 2014-10-09 DIAGNOSIS — N529 Male erectile dysfunction, unspecified: Secondary | ICD-10-CM | POA: Insufficient documentation

## 2014-10-09 DIAGNOSIS — F17210 Nicotine dependence, cigarettes, uncomplicated: Secondary | ICD-10-CM | POA: Diagnosis not present

## 2014-10-09 DIAGNOSIS — Z86010 Personal history of colonic polyps: Secondary | ICD-10-CM | POA: Insufficient documentation

## 2014-10-09 DIAGNOSIS — E291 Testicular hypofunction: Secondary | ICD-10-CM | POA: Insufficient documentation

## 2014-10-09 DIAGNOSIS — Z9049 Acquired absence of other specified parts of digestive tract: Secondary | ICD-10-CM | POA: Diagnosis not present

## 2014-10-09 DIAGNOSIS — N200 Calculus of kidney: Secondary | ICD-10-CM | POA: Diagnosis not present

## 2014-10-09 DIAGNOSIS — R310 Gross hematuria: Secondary | ICD-10-CM | POA: Diagnosis not present

## 2014-10-09 DIAGNOSIS — K5730 Diverticulosis of large intestine without perforation or abscess without bleeding: Secondary | ICD-10-CM | POA: Insufficient documentation

## 2014-10-09 DIAGNOSIS — Z7982 Long term (current) use of aspirin: Secondary | ICD-10-CM | POA: Insufficient documentation

## 2014-10-09 SURGERY — LITHOTRIPSY, ESWL
Anesthesia: LOCAL | Laterality: Right

## 2014-10-09 MED ORDER — PROMETHAZINE HCL 12.5 MG PO TABS: 12.5000 mg | ORAL_TABLET | Freq: Four times a day (QID) | ORAL | Status: DC | PRN

## 2014-10-09 MED ORDER — IBUPROFEN 200 MG PO TABS: 200.0000 mg | ORAL_TABLET | Freq: Four times a day (QID) | ORAL | Status: DC | PRN

## 2014-10-09 MED ORDER — OXYCODONE-ACETAMINOPHEN 5-325 MG PO TABS: 1.0000 | ORAL_TABLET | Freq: Four times a day (QID) | ORAL | Status: DC | PRN

## 2014-10-09 MED ORDER — DIPHENHYDRAMINE HCL 25 MG PO CAPS
25.0000 mg | ORAL_CAPSULE | ORAL | Status: AC
  Administered 2014-10-09: 25 mg via ORAL
  Filled 2014-10-09: qty 1

## 2014-10-09 MED ORDER — ASPIRIN EC 81 MG PO TBEC
81.0000 mg | DELAYED_RELEASE_TABLET | Freq: Every day | ORAL | Status: AC
Start: 2014-10-13 — End: ?

## 2014-10-09 MED ORDER — DIAZEPAM 5 MG PO TABS
10.0000 mg | ORAL_TABLET | ORAL | Status: AC
  Administered 2014-10-09: 10 mg via ORAL
  Filled 2014-10-09: qty 2

## 2014-10-09 MED ORDER — CIPROFLOXACIN HCL 500 MG PO TABS
500.0000 mg | ORAL_TABLET | ORAL | Status: AC
  Administered 2014-10-09: 500 mg via ORAL
  Filled 2014-10-09: qty 1

## 2014-10-09 MED ORDER — SODIUM CHLORIDE 0.9 % IV SOLN
INTRAVENOUS | Status: DC
  Administered 2014-10-09: 14:00:00 via INTRAVENOUS

## 2014-10-09 NOTE — Discharge Instructions (Signed)

## 2014-10-09 NOTE — Op Note (Signed)
Right ESWL  6 mm RIGHT upper pole Stone  Pt stable. He moved a lot. See [facility] Op Note.

## 2014-10-09 NOTE — H&P (Signed)
H&P  Chief Complaint: 6 mm RUP renal stone  History of Present Illness: Pt with 6 mm RUP stone. Continues to have flank pain and desires right ESWL. He has been well without dysuria, gross hematuria, or fever.   Past Medical History  Diagnosis Date  . Elevated PSA     recently biopsied, seeing Dr. Jasmine December at [facility]  . Kidney stones 2007  . ED (erectile dysfunction)   . Hypogonadism male   . Decreased libido   . Personal history of urinary calculi   . RUQ abdominal pain   . Diverticulosis   . Hyperplastic colon polyp    Past Surgical History  Procedure Laterality Date  . Appendectomy    . Kidney stone surgery  2007    ?laser, possibly lithotripsy  . Kidney stone surgery  2007    ?laser, possibly lithotripsy  . Tonsillectomy  age 49  . Inguinal hernia repair      RIH  . Cystoscopy w/ ureteroscopy      hx of    Home Medications:  Prescriptions prior to admission  Medication Sig Dispense Refill Last Dose  . aspirin EC 81 MG tablet Take 81 mg by mouth daily.   10/05/2014  . hyoscyamine (LEVBID) 0.375 MG 12 hr tablet Take 1 tablet (0.375 mg total) by mouth every morning. 30 tablet 2 10/02/2014  . hyoscyamine (LEVSIN SL) 0.125 MG SL tablet PLACE 1 TABLET UNDER THE TONGUE EVERY 4 HOURS AS NEEDED FOR CRAMPING 30 tablet 0 10/02/2014  . ibuprofen (ADVIL,MOTRIN) 200 MG tablet Take 200 mg by mouth every 6 (six) hours as needed (Pain).   10/04/2014  . losartan (COZAAR) 100 MG tablet Take 100 mg by mouth every morning.    10/09/2014 at 0730  . omeprazole (PRILOSEC) 20 MG capsule Take 20 mg by mouth daily.    10/09/2014 at 0730  . tadalafil (CIALIS) 5 MG tablet Take 5 mg by mouth daily as needed for erectile dysfunction.   Taking  . tamsulosin (FLOMAX) 0.4 MG CAPS Take 0.4 mg by mouth daily.    10/09/2014 at 0730  . vardenafil (LEVITRA) 20 MG tablet Take 20 mg by mouth daily as needed for erectile dysfunction.    Taking  . Multiple Vitamins-Minerals (MULTIVITAMIN WITH MINERALS) tablet Take 1  tablet by mouth daily.   09/25/2014   Allergies: No Known Allergies  Family History  Problem Relation Age of Onset  . Stroke Mother 24  . Heart disease Mother 98  . Prostate cancer Father 70  . Heart disease Father 87  . Hyperlipidemia Father   . Stroke Sister   . Lung cancer Maternal Uncle   . Breast cancer Maternal Aunt     mid-40's  . Prostate cancer Paternal Uncle   . Prostate cancer Cousin   . Colon cancer Neg Hx    Social History:  reports that he has been smoking Cigarettes.  He has been smoking about 0.25 packs per day. He has never used smokeless tobacco. He reports that he drinks about 8.4 oz of alcohol per week. He reports that he does not use illicit drugs.  ROS: A complete review of systems was performed.  All systems are negative except for pertinent findings as noted. Review of Systems  All other systems reviewed and are negative.    Physical Exam:  Vital signs in last 24 hours: Temp:  [97.3 F (36.3 C)] 97.3 F (36.3 C) (03/03 1255) Pulse Rate:  [85] 85 (03/03 1255) Resp:  [18] 18 (  03/03 1255) BP: (144)/(86) 144/86 mmHg (03/03 1255) SpO2:  [99 %] 99 % (03/03 1255) Weight:  [85.333 kg (188 lb 2 oz)] 85.333 kg (188 lb 2 oz) (03/03 1255) General:  Alert and oriented, No acute distress HEENT: Normocephalic, atraumatic Neck: No JVD or lymphadenopathy Cardiovascular: Regular rate and rhythm Lungs: Regular rate and effort Abdomen: Soft, nontender, nondistended, no abdominal masses Back: No CVA tenderness Extremities: No edema Neurologic: Grossly intact  Laboratory Data:  No results found for this or any previous visit (from the past 24 hour(s)). No results found for this or any previous visit (from the past 240 hour(s)). Creatinine: No results for input(s): CREATININE in the last 168 hours.  Impression/Assessment: I discussed with the patient the nature, potential benefits, risks and alternatives to right ESWL, including side effects of the proposed  treatment, the likelihood of the patient achieving the goals of the procedure, and any potential problems that might occur during the procedure or recuperation. All questions answered. Patient elects to proceed.    Adrian Baird 10/09/2014, 3:45 PM

## 2015-06-09 ENCOUNTER — Telehealth: Payer: Self-pay | Admitting: *Deleted

## 2015-06-09 NOTE — Telephone Encounter (Signed)
-----   Message from Hulan Saas, RN sent at 04/07/2015  3:35 PM EDT ----- No new GI ----- Message -----    From: Hulan Saas, RN    Sent: 06/09/2015      To: Hulan Saas, RN  Patient needs CT chest to f/u on Left lower lung nodule seen on CT 11/15.(DB)

## 2015-06-09 NOTE — Telephone Encounter (Signed)
Spoke with patient and he is following this with his PCP now. Scheduled OV with Dr. Silverio Decamp on 08/13/15.

## 2015-06-09 NOTE — Telephone Encounter (Signed)
Left a message for patient to call back. 

## 2015-07-06 ENCOUNTER — Other Ambulatory Visit: Payer: Self-pay | Admitting: Internal Medicine

## 2015-07-06 DIAGNOSIS — R911 Solitary pulmonary nodule: Secondary | ICD-10-CM

## 2015-07-14 ENCOUNTER — Ambulatory Visit
Admission: RE | Admit: 2015-07-14 | Discharge: 2015-07-14 | Disposition: A | Discharge status: Home / Self Care | Payer: 59 | Source: Ambulatory Visit | Attending: Internal Medicine | Admitting: Internal Medicine

## 2015-07-14 DIAGNOSIS — R911 Solitary pulmonary nodule: Secondary | ICD-10-CM

## 2015-08-13 ENCOUNTER — Ambulatory Visit (INDEPENDENT_AMBULATORY_CARE_PROVIDER_SITE_OTHER): Payer: 59 | Admitting: Gastroenterology

## 2015-08-13 ENCOUNTER — Encounter: Payer: Self-pay | Admitting: Gastroenterology

## 2015-08-13 VITALS — BP 130/74 | HR 100 | Ht 70.25 in | Wt 203.1 lb

## 2015-08-13 DIAGNOSIS — K824 Cholesterolosis of gallbladder: Secondary | ICD-10-CM | POA: Diagnosis not present

## 2015-08-13 DIAGNOSIS — R1011 Right upper quadrant pain: Secondary | ICD-10-CM

## 2015-08-13 NOTE — Patient Instructions (Signed)
You have been scheduled for an abdominal ultrasound at [facility] (1st floor of hospital) on 08/18/2015 at 8:30am. Please arrive 15 minutes prior to your appointment for registration. Make certain not to have anything to eat or drink 6 hours prior to your appointment. Should you need to reschedule your appointment, please contact radiology at [phone]. This test typically takes about 30 minutes to perform.  Follow up as needed

## 2015-08-13 NOTE — Progress Notes (Signed)
ALIAS GRAHN    [id number]    12/03/1950  Primary Care 59, MD  Referring Physician: Tommy Medal, MD [address] [place], [place] [address]  Chief complaint:  Abdominal pain  HPI:  65 year old white male, former patient of Dr. Sharlett Iles & Dr Olevia Perches who has last seen in November 2015 forright middle quadrant abdominal pain.  The pain has since resolve after he was treated for kidney stone He had a colonoscopy in September 2013 with findings of mild diverticulosis and a hyperplastic polyp. He has a small gallbladder polyp on an ultrasound from September 2013. There is a strong family history of gallbladder disease in several direct relatives. He denies jaundice or fever. He has a history of alcohol abuse.  His weight has been stable.   Outpatient Encounter Prescriptions as of 08/13/2015  Medication Sig  . aspirin EC 81 MG tablet Take 1 tablet (81 mg total) by mouth daily.  Marland Kitchen ibuprofen (ADVIL,MOTRIN) 200 MG tablet Take 1 tablet (200 mg total) by mouth every 6 (six) hours as needed (Pain).  Marland Kitchen losartan (COZAAR) 100 MG tablet Take 100 mg by mouth every morning.   . Multiple Vitamins-Minerals (MULTIVITAMIN WITH MINERALS) tablet Take 1 tablet by mouth daily.  Marland Kitchen omeprazole (PRILOSEC) 20 MG capsule Take 20 mg by mouth daily.   Marland Kitchen oxyCODONE-acetaminophen (ROXICET) 5-325 MG per tablet Take 1-2 tablets by mouth every 6 (six) hours as needed for severe pain.  . promethazine (PHENERGAN) 12.5 MG tablet Take 1 tablet (12.5 mg total) by mouth every 6 (six) hours as needed for nausea or vomiting.  . tadalafil (CIALIS) 5 MG tablet Take 5 mg by mouth daily as needed for erectile dysfunction.  . tamsulosin (FLOMAX) 0.4 MG CAPS Take 0.4 mg by mouth daily.   . vardenafil (LEVITRA) 20 MG tablet Take 20 mg by mouth daily as needed for erectile dysfunction.   . [DISCONTINUED] hyoscyamine (LEVBID) 0.375 MG 12 hr tablet Take 1 tablet (0.375 mg total) by mouth every  morning.  . [DISCONTINUED] hyoscyamine (LEVSIN SL) 0.125 MG SL tablet PLACE 1 TABLET UNDER THE TONGUE EVERY 4 HOURS AS NEEDED FOR CRAMPING   No facility-administered encounter medications on file as of 08/13/2015.    Allergies as of 08/13/2015  . (No Known Allergies)    Past Medical History  Diagnosis Date  . Elevated PSA     recently biopsied, seeing Dr. Jasmine December at [facility]  . Kidney stones 2007  . ED (erectile dysfunction)   . Hypogonadism male   . Decreased libido   . Personal history of urinary calculi   . RUQ abdominal pain   . Diverticulosis   . Hyperplastic colon polyp     Past Surgical History  Procedure Laterality Date  . Appendectomy    . Kidney stone surgery  2007    ?laser, possibly lithotripsy  . Kidney stone surgery  2007    ?laser, possibly lithotripsy  . Tonsillectomy  age 79  . Inguinal hernia repair      RIH  . Cystoscopy w/ ureteroscopy      hx of    Family History  Problem Relation Age of Onset  . Stroke Mother 78  . Heart disease Mother 25  . Prostate cancer Father 58  . Heart disease Father 2  . Hyperlipidemia Father   . Stroke Sister   . Lung cancer Maternal Uncle   . Breast cancer Maternal Aunt  mid-40's  . Prostate cancer Paternal Uncle   . Prostate cancer Cousin   . Colon cancer Neg Hx     Social History   Social History  . Marital Status: Married    Spouse Name: N/A  . Number of Children: 2  . Years of Education: N/A   Occupational History  . Art gallery manager, Freight forwarder    Social History Main Topics  . Smoking status: Current Some Day Smoker -- 0.25 packs/day    Types: Cigarettes  . Smokeless tobacco: Never Used     Comment: Trying to quit   . Alcohol Use: 8.4 oz/week    7 Glasses of wine, 7 Cans of beer per week     Comment: 1-2 drinks per day  . Drug Use: No  . Sexual Activity: Not on file   Other Topics Concern  . Not on file   Social History Narrative   re-married. 2 children--daughter in [facility], son in [place]       Review of systems: Review of Systems  Constitutional: Negative for fever and chills.  HENT: Negative.   Eyes: Negative for blurred vision.  Respiratory: Negative for cough, shortness of breath and wheezing.   Cardiovascular: Negative for chest pain and palpitations.  Gastrointestinal: as per HPI Genitourinary: Negative for dysuria, urgency, frequency and hematuria.  Musculoskeletal: Negative for myalgias, back pain and joint pain.  Skin: Negative for itching and rash.  Neurological: Negative for dizziness, tremors, focal weakness, seizures and loss of consciousness.  Endo/Heme/Allergies: Negative for environmental allergies.  Psychiatric/Behavioral: Negative for depression, suicidal ideas and hallucinations.  All other systems reviewed and are negative.   Physical Exam: Filed Vitals:   08/13/15 0822  BP: 130/74  Pulse: 100   Gen:      No acute distress HEENT:  EOMI, sclera anicteric Neck:     No masses; no thyromegaly Lungs:    Clear to auscultation bilaterally; normal respiratory effort CV:         Regular rate and rhythm; no murmurs Abd:      + bowel sounds; soft, non-tender; no palpable masses, no distension Ext:    No edema; adequate peripheral perfusion Skin:      Warm and dry; no rash Neuro: alert and oriented x 3 Psych: normal mood and affect  Data Reviewed:  As per HPI   Assessment and Plan/Recommendations: 32 yr M with h/o RLQ abdominal pain secondary to nephrolithiasis which has significantly improved s/p lithotripsy here for follow up visit with intermittent R upper quadrant pain.  Will schedule for abdominal ultrasound to evaluate for possible gallstone and determine any change in size of gallbladder polyp  due for screening colooscopy in 2023 Return as needed  K. Denzil Magnuson , MD [phone] Mon-Fri 8a-5p [phone] after 5p, weekends, holidays

## 2015-08-18 ENCOUNTER — Ambulatory Visit (HOSPITAL_COMMUNITY)
Admission: RE | Admit: 2015-08-18 | Discharge: 2015-08-18 | Disposition: A | Discharge status: Home / Self Care | Payer: 59 | Source: Ambulatory Visit | Attending: Gastroenterology | Admitting: Gastroenterology

## 2015-08-18 ENCOUNTER — Other Ambulatory Visit: Payer: Self-pay | Admitting: Gastroenterology

## 2015-08-18 DIAGNOSIS — K824 Cholesterolosis of gallbladder: Secondary | ICD-10-CM

## 2015-08-18 DIAGNOSIS — R932 Abnormal findings on diagnostic imaging of liver and biliary tract: Secondary | ICD-10-CM | POA: Diagnosis not present

## 2015-09-01 ENCOUNTER — Other Ambulatory Visit: Payer: Self-pay | Admitting: Internal Medicine

## 2015-09-01 DIAGNOSIS — R911 Solitary pulmonary nodule: Secondary | ICD-10-CM

## 2015-10-21 ENCOUNTER — Ambulatory Visit
Admission: RE | Admit: 2015-10-21 | Discharge: 2015-10-21 | Disposition: A | Discharge status: Home / Self Care | Payer: 59 | Source: Ambulatory Visit | Attending: Internal Medicine | Admitting: Internal Medicine

## 2015-10-21 DIAGNOSIS — R911 Solitary pulmonary nodule: Secondary | ICD-10-CM

## 2015-11-19 DIAGNOSIS — E1122 Type 2 diabetes mellitus with diabetic chronic kidney disease: Secondary | ICD-10-CM | POA: Diagnosis not present

## 2015-11-19 DIAGNOSIS — I129 Hypertensive chronic kidney disease with stage 1 through stage 4 chronic kidney disease, or unspecified chronic kidney disease: Secondary | ICD-10-CM | POA: Diagnosis not present

## 2015-11-27 DIAGNOSIS — I129 Hypertensive chronic kidney disease with stage 1 through stage 4 chronic kidney disease, or unspecified chronic kidney disease: Secondary | ICD-10-CM | POA: Diagnosis not present

## 2015-11-27 DIAGNOSIS — I700 Atherosclerosis of aorta: Secondary | ICD-10-CM | POA: Diagnosis not present

## 2015-11-27 DIAGNOSIS — E1122 Type 2 diabetes mellitus with diabetic chronic kidney disease: Secondary | ICD-10-CM | POA: Diagnosis not present

## 2015-11-27 DIAGNOSIS — E785 Hyperlipidemia, unspecified: Secondary | ICD-10-CM | POA: Diagnosis not present

## 2016-02-19 DIAGNOSIS — E1122 Type 2 diabetes mellitus with diabetic chronic kidney disease: Secondary | ICD-10-CM | POA: Diagnosis not present

## 2016-02-19 DIAGNOSIS — I129 Hypertensive chronic kidney disease with stage 1 through stage 4 chronic kidney disease, or unspecified chronic kidney disease: Secondary | ICD-10-CM | POA: Diagnosis not present

## 2016-02-26 DIAGNOSIS — I129 Hypertensive chronic kidney disease with stage 1 through stage 4 chronic kidney disease, or unspecified chronic kidney disease: Secondary | ICD-10-CM | POA: Diagnosis not present

## 2016-02-26 DIAGNOSIS — E1122 Type 2 diabetes mellitus with diabetic chronic kidney disease: Secondary | ICD-10-CM | POA: Diagnosis not present

## 2016-02-26 DIAGNOSIS — N182 Chronic kidney disease, stage 2 (mild): Secondary | ICD-10-CM | POA: Diagnosis not present

## 2016-02-26 DIAGNOSIS — E785 Hyperlipidemia, unspecified: Secondary | ICD-10-CM | POA: Diagnosis not present

## 2016-02-26 DIAGNOSIS — H6120 Impacted cerumen, unspecified ear: Secondary | ICD-10-CM | POA: Diagnosis not present

## 2016-02-26 DIAGNOSIS — H9190 Unspecified hearing loss, unspecified ear: Secondary | ICD-10-CM | POA: Diagnosis not present

## 2016-03-16 DIAGNOSIS — H9193 Unspecified hearing loss, bilateral: Secondary | ICD-10-CM | POA: Diagnosis not present

## 2016-03-16 DIAGNOSIS — E119 Type 2 diabetes mellitus without complications: Secondary | ICD-10-CM | POA: Diagnosis not present

## 2016-03-31 DIAGNOSIS — H524 Presbyopia: Secondary | ICD-10-CM | POA: Diagnosis not present

## 2016-03-31 DIAGNOSIS — H2513 Age-related nuclear cataract, bilateral: Secondary | ICD-10-CM | POA: Diagnosis not present

## 2016-03-31 DIAGNOSIS — H04123 Dry eye syndrome of bilateral lacrimal glands: Secondary | ICD-10-CM | POA: Diagnosis not present

## 2016-08-25 DIAGNOSIS — I129 Hypertensive chronic kidney disease with stage 1 through stage 4 chronic kidney disease, or unspecified chronic kidney disease: Secondary | ICD-10-CM | POA: Diagnosis not present

## 2016-08-25 DIAGNOSIS — E1122 Type 2 diabetes mellitus with diabetic chronic kidney disease: Secondary | ICD-10-CM | POA: Diagnosis not present

## 2016-08-25 DIAGNOSIS — Z0001 Encounter for general adult medical examination with abnormal findings: Secondary | ICD-10-CM | POA: Diagnosis not present

## 2016-08-25 DIAGNOSIS — Z125 Encounter for screening for malignant neoplasm of prostate: Secondary | ICD-10-CM | POA: Diagnosis not present

## 2016-09-01 DIAGNOSIS — Z0000 Encounter for general adult medical examination without abnormal findings: Secondary | ICD-10-CM | POA: Diagnosis not present

## 2016-11-03 DIAGNOSIS — N400 Enlarged prostate without lower urinary tract symptoms: Secondary | ICD-10-CM | POA: Diagnosis not present

## 2016-11-03 DIAGNOSIS — N5201 Erectile dysfunction due to arterial insufficiency: Secondary | ICD-10-CM | POA: Diagnosis not present

## 2016-11-03 DIAGNOSIS — R972 Elevated prostate specific antigen [PSA]: Secondary | ICD-10-CM | POA: Diagnosis not present

## 2016-12-15 DIAGNOSIS — R197 Diarrhea, unspecified: Secondary | ICD-10-CM | POA: Diagnosis not present

## 2017-02-22 DIAGNOSIS — E7800 Pure hypercholesterolemia, unspecified: Secondary | ICD-10-CM | POA: Diagnosis not present

## 2017-02-22 DIAGNOSIS — E119 Type 2 diabetes mellitus without complications: Secondary | ICD-10-CM | POA: Diagnosis not present

## 2017-03-01 ENCOUNTER — Other Ambulatory Visit: Payer: Self-pay | Admitting: Internal Medicine

## 2017-03-01 DIAGNOSIS — E1122 Type 2 diabetes mellitus with diabetic chronic kidney disease: Secondary | ICD-10-CM | POA: Diagnosis not present

## 2017-03-01 DIAGNOSIS — I129 Hypertensive chronic kidney disease with stage 1 through stage 4 chronic kidney disease, or unspecified chronic kidney disease: Secondary | ICD-10-CM | POA: Diagnosis not present

## 2017-03-01 DIAGNOSIS — R911 Solitary pulmonary nodule: Secondary | ICD-10-CM

## 2017-03-01 DIAGNOSIS — N182 Chronic kidney disease, stage 2 (mild): Secondary | ICD-10-CM | POA: Diagnosis not present

## 2017-03-01 DIAGNOSIS — E785 Hyperlipidemia, unspecified: Secondary | ICD-10-CM | POA: Diagnosis not present

## 2017-03-03 ENCOUNTER — Ambulatory Visit
Admission: RE | Admit: 2017-03-03 | Discharge: 2017-03-03 | Disposition: A | Discharge status: Home / Self Care | Payer: BLUE CROSS/BLUE SHIELD | Source: Ambulatory Visit | Attending: Internal Medicine | Admitting: Internal Medicine

## 2017-03-03 DIAGNOSIS — R911 Solitary pulmonary nodule: Secondary | ICD-10-CM | POA: Diagnosis not present

## 2017-03-15 DIAGNOSIS — E669 Obesity, unspecified: Secondary | ICD-10-CM | POA: Diagnosis not present

## 2017-03-29 DIAGNOSIS — E669 Obesity, unspecified: Secondary | ICD-10-CM | POA: Diagnosis not present

## 2017-04-03 DIAGNOSIS — E119 Type 2 diabetes mellitus without complications: Secondary | ICD-10-CM | POA: Diagnosis not present

## 2017-04-03 DIAGNOSIS — H2513 Age-related nuclear cataract, bilateral: Secondary | ICD-10-CM | POA: Diagnosis not present

## 2017-04-03 DIAGNOSIS — H5203 Hypermetropia, bilateral: Secondary | ICD-10-CM | POA: Diagnosis not present

## 2017-04-09 IMAGING — CT CT CHEST W/O CM
3 of 4 series · 16 of 30 positions shown, 18 images · non-contrast
Comparison: CT abdomen pelvis of 06/13/2014

CLINICAL DATA: Followup lung nodules, no history of cancer, former
smoking history, short of breath

EXAM:
CT CHEST WITHOUT CONTRAST
TECHNIQUE: Multidetector CT imaging of the chest was performed following the
standard protocol without IV contrast.

[Series 3: chest w/o · axial · non-contrast · 0.70mm/px · z∈[-255,-60]mm · 4 of 66 slices shown]
[im 14/66  lung]
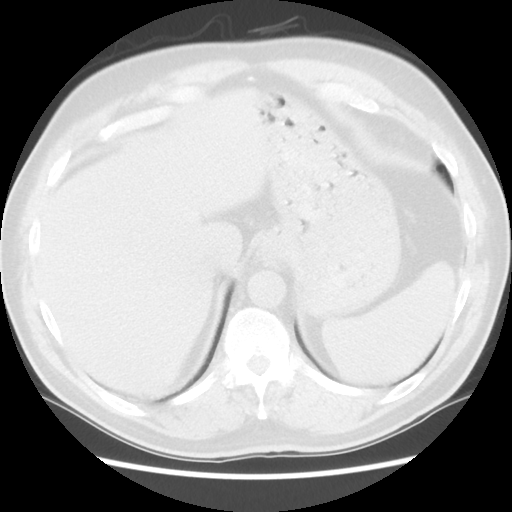
[im 27/66  lung]
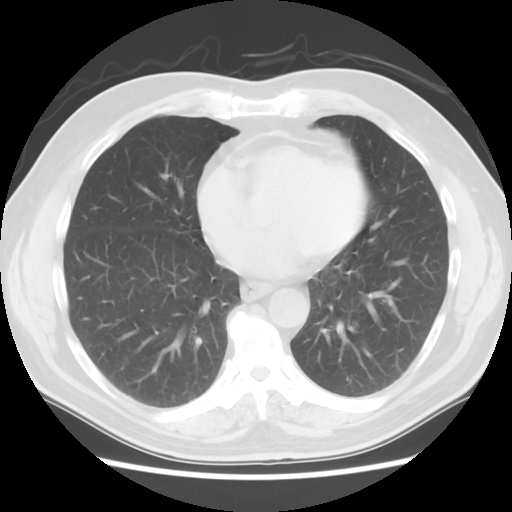
[im 40/66  lung]
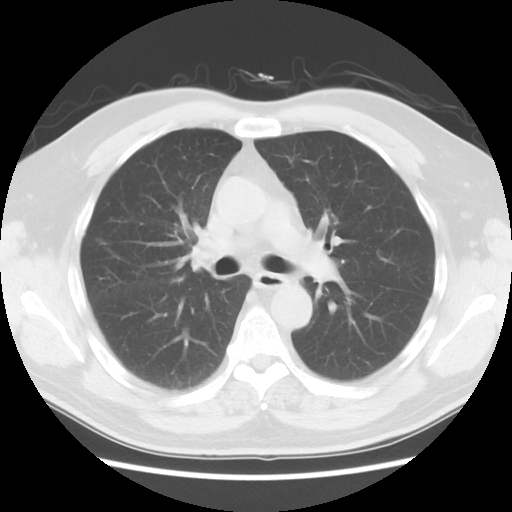
[im 53/66  lung]
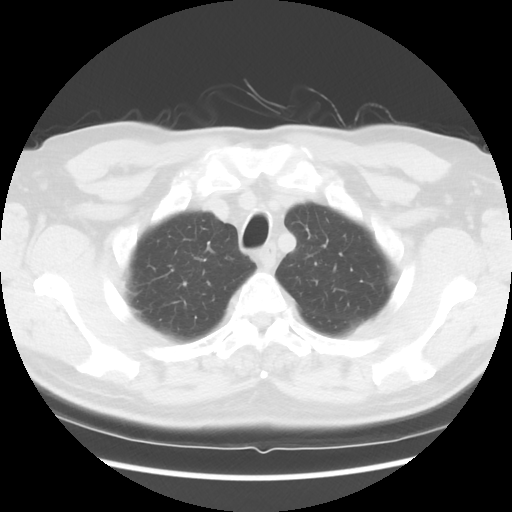

[Series 4: lung windows · axial · 0.70mm/px · z∈[-270,-50]mm · 5 of 66 slices shown, 7 images]
[im 11/66  mediastinal]
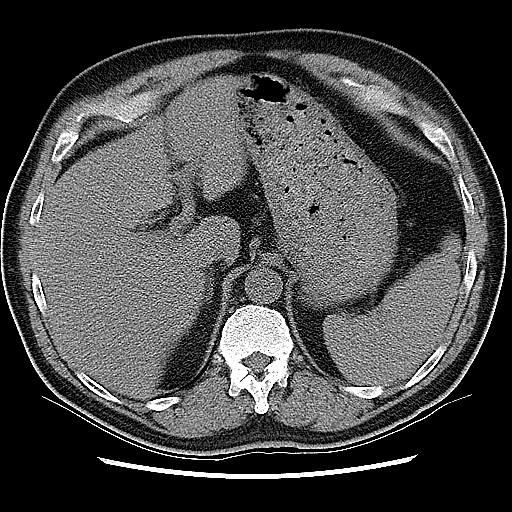
[im 11/66  lung]
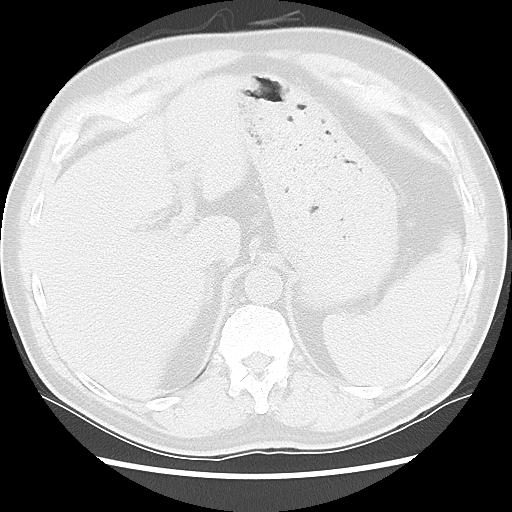
[im 22/66  lung]
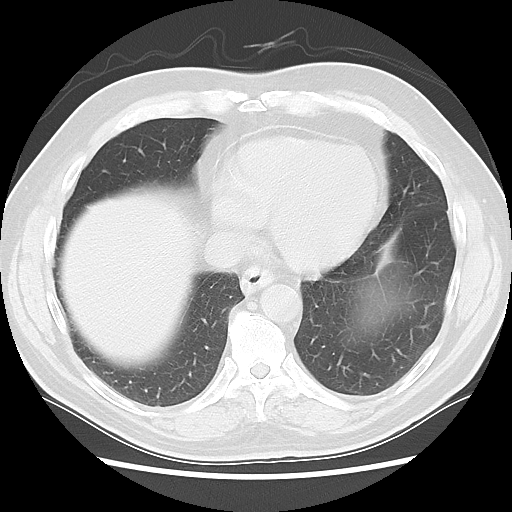
[im 33/66  lung]
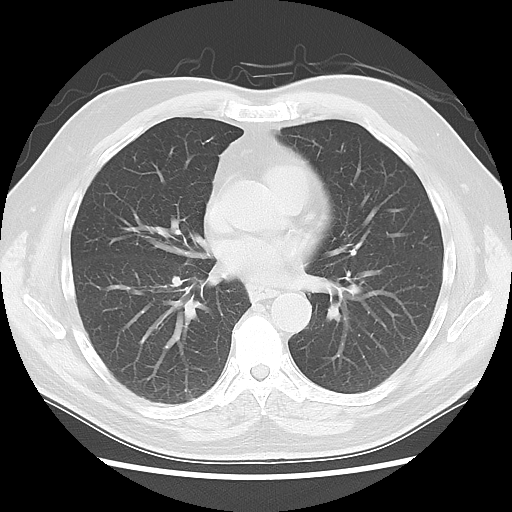
[im 44/66  lung]
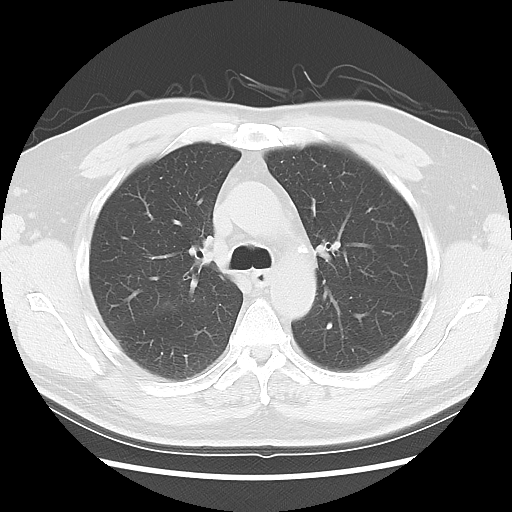
[im 55/66  mediastinal]
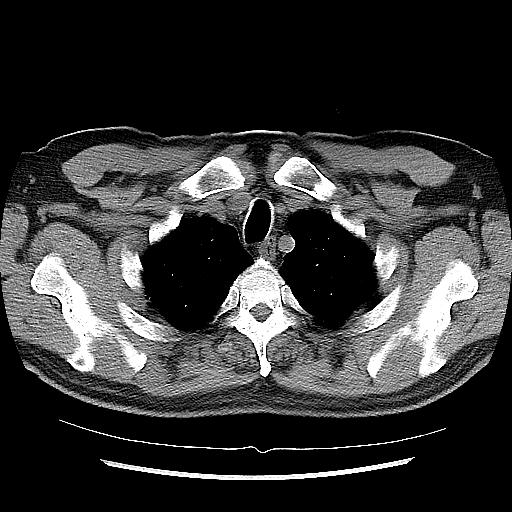
[im 55/66  lung]
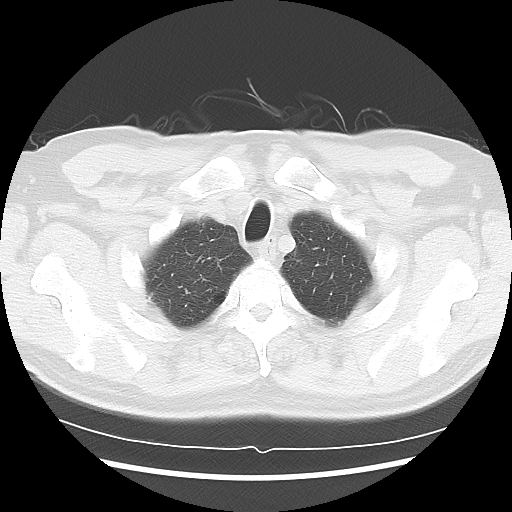

[Series 602: sagittal body · sagittal · 0.70mm/px · 7 of 145 slices shown]
[im 11/145  mediastinal]
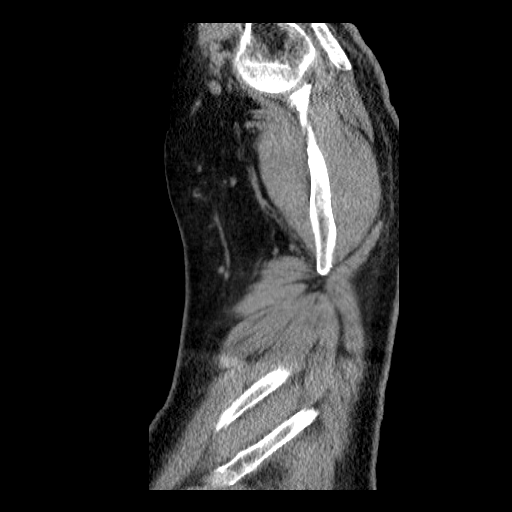
[im 31/145  mediastinal]
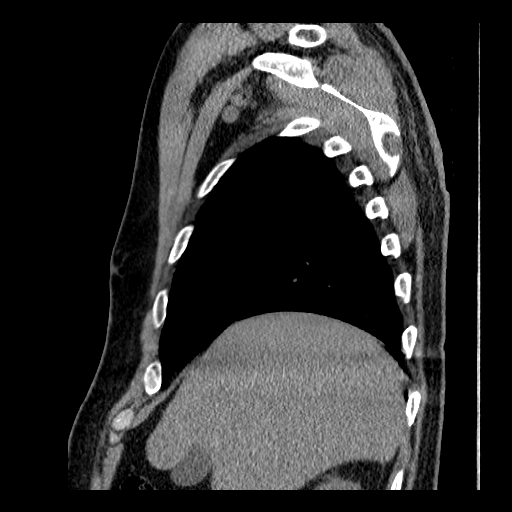
[im 52/145  mediastinal]
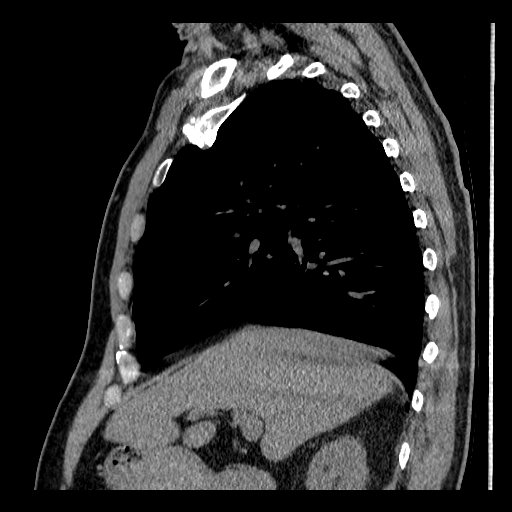
[im 62/145  mediastinal]
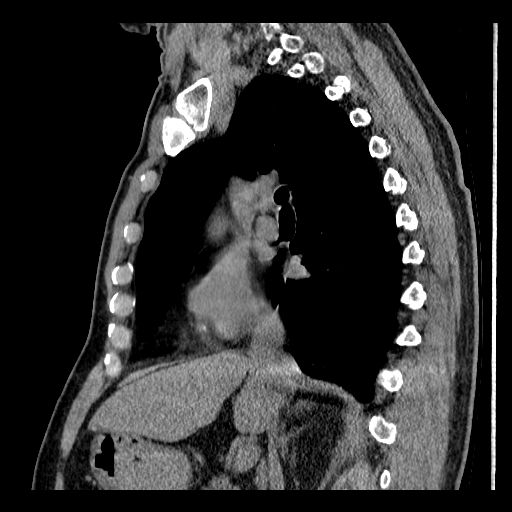
[im 83/145  mediastinal]
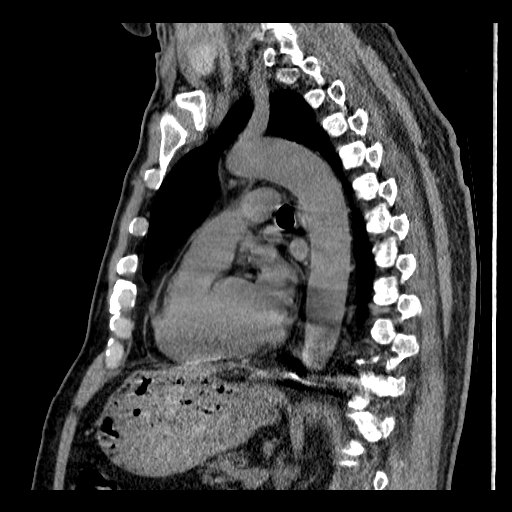
[im 93/145  mediastinal]
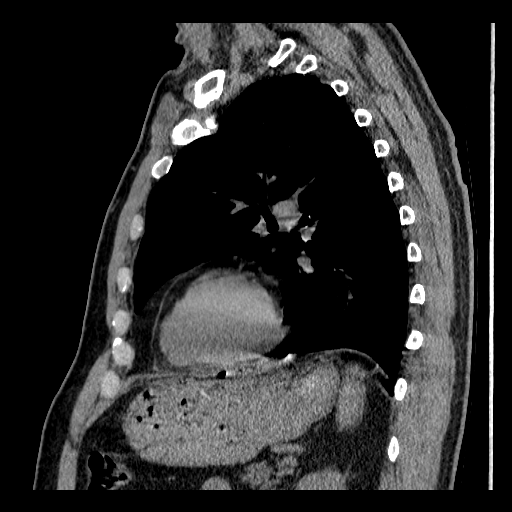
[im 114/145  mediastinal]
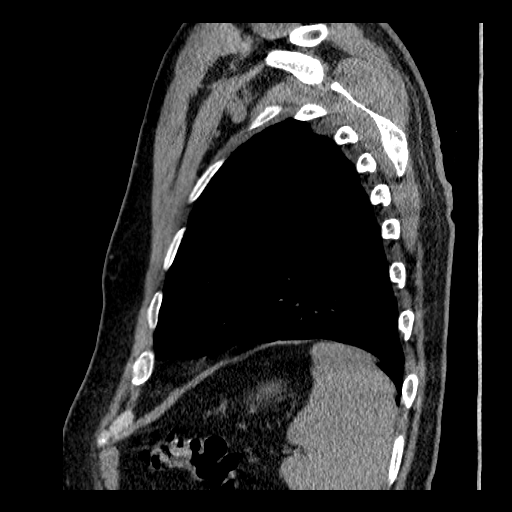

[16 of 30 positions shown; findings below may reference images not displayed]

FINDINGS: The prior CT was a CT of the abdomen and pelvis which included only
a small portion of the lung bases, on which a 3 mm noncalcified
nodule is noted posteriorly within the left lower lobe.

On the current study biapical pleural parenchymal scarring is
present. There is a 6 mm noncalcified nodule in the right upper lobe
near the apex which may simply represent scarring. In view of the
patient's smoking history, followup CT is recommended. The only
other nodule is the 3 mm noncalcified nodule described on the prior
report at the left lung base subpleural in location which appears
completely stable. No new or enlarging nodule is seen on the images
through the lung bases.

However, there is a vague ground-glass opacity in the right upper
lobe just above the minor fissure of approximately 10 mm in diameter
with no solid components. A second ground glass opacity is noted in
the right middle lobe at the junction of the major and minor
fissures of approximately 6 mm in diameter with no soft tissue
component. A followup CT of the chest is recommended in 3 months to
document persistence, and if these ground-glass opacities do persist
then annual follow-up for up to 3 years is recommended. No
parenchymal infiltrate or effusion is seen. The central airway is
patent. Incidental imaging of the upper abdomen is unremarkable.
Only mild degenerative change is noted in the mid to lower thoracic
spine.
IMPRESSION: 1. Stable 3 mm noncalcified nodule in the left lower lobe.
2. 6 mm non noncalcified nodule in the right upper lobe may
represent scarring with biapical pleural parenchymal scarring
present but followup is recommended as below.
3. Two ground-glass opacities are noted with no definite solid
components. Recommend followup CT of the chest in 3 months to assess
persistence, and if these ground-glass opacities do persist, then
followup CT of the chest is recommended annually for up to 3 years.

## 2017-04-11 DIAGNOSIS — E669 Obesity, unspecified: Secondary | ICD-10-CM | POA: Diagnosis not present

## 2017-09-07 DIAGNOSIS — E1122 Type 2 diabetes mellitus with diabetic chronic kidney disease: Secondary | ICD-10-CM | POA: Diagnosis not present

## 2017-09-07 DIAGNOSIS — E785 Hyperlipidemia, unspecified: Secondary | ICD-10-CM | POA: Diagnosis not present

## 2017-09-07 DIAGNOSIS — N529 Male erectile dysfunction, unspecified: Secondary | ICD-10-CM | POA: Diagnosis not present

## 2017-09-07 DIAGNOSIS — I129 Hypertensive chronic kidney disease with stage 1 through stage 4 chronic kidney disease, or unspecified chronic kidney disease: Secondary | ICD-10-CM | POA: Diagnosis not present

## 2017-09-07 DIAGNOSIS — Z0000 Encounter for general adult medical examination without abnormal findings: Secondary | ICD-10-CM | POA: Diagnosis not present

## 2017-09-13 DIAGNOSIS — Z23 Encounter for immunization: Secondary | ICD-10-CM | POA: Diagnosis not present

## 2017-09-13 DIAGNOSIS — E785 Hyperlipidemia, unspecified: Secondary | ICD-10-CM | POA: Diagnosis not present

## 2017-09-13 DIAGNOSIS — Z0000 Encounter for general adult medical examination without abnormal findings: Secondary | ICD-10-CM | POA: Diagnosis not present

## 2017-09-13 DIAGNOSIS — I129 Hypertensive chronic kidney disease with stage 1 through stage 4 chronic kidney disease, or unspecified chronic kidney disease: Secondary | ICD-10-CM | POA: Diagnosis not present

## 2017-09-13 DIAGNOSIS — K219 Gastro-esophageal reflux disease without esophagitis: Secondary | ICD-10-CM | POA: Diagnosis not present

## 2017-11-08 DIAGNOSIS — N5201 Erectile dysfunction due to arterial insufficiency: Secondary | ICD-10-CM | POA: Diagnosis not present

## 2017-11-08 DIAGNOSIS — N486 Induration penis plastica: Secondary | ICD-10-CM | POA: Diagnosis not present

## 2017-11-08 DIAGNOSIS — N400 Enlarged prostate without lower urinary tract symptoms: Secondary | ICD-10-CM | POA: Diagnosis not present

## 2017-12-11 DIAGNOSIS — E1122 Type 2 diabetes mellitus with diabetic chronic kidney disease: Secondary | ICD-10-CM | POA: Diagnosis not present

## 2018-02-21 DIAGNOSIS — E1122 Type 2 diabetes mellitus with diabetic chronic kidney disease: Secondary | ICD-10-CM | POA: Diagnosis not present

## 2018-02-21 DIAGNOSIS — F17210 Nicotine dependence, cigarettes, uncomplicated: Secondary | ICD-10-CM | POA: Diagnosis not present

## 2018-02-21 DIAGNOSIS — R918 Other nonspecific abnormal finding of lung field: Secondary | ICD-10-CM | POA: Diagnosis not present

## 2018-03-12 DIAGNOSIS — E1122 Type 2 diabetes mellitus with diabetic chronic kidney disease: Secondary | ICD-10-CM | POA: Diagnosis not present

## 2018-03-12 DIAGNOSIS — E785 Hyperlipidemia, unspecified: Secondary | ICD-10-CM | POA: Diagnosis not present

## 2018-03-13 ENCOUNTER — Other Ambulatory Visit: Payer: Self-pay | Admitting: Internal Medicine

## 2018-03-13 DIAGNOSIS — R918 Other nonspecific abnormal finding of lung field: Secondary | ICD-10-CM

## 2018-03-15 DIAGNOSIS — R918 Other nonspecific abnormal finding of lung field: Secondary | ICD-10-CM | POA: Diagnosis not present

## 2018-03-15 DIAGNOSIS — F17210 Nicotine dependence, cigarettes, uncomplicated: Secondary | ICD-10-CM | POA: Diagnosis not present

## 2018-03-15 DIAGNOSIS — E1122 Type 2 diabetes mellitus with diabetic chronic kidney disease: Secondary | ICD-10-CM | POA: Diagnosis not present

## 2018-03-15 DIAGNOSIS — I10 Essential (primary) hypertension: Secondary | ICD-10-CM | POA: Diagnosis not present

## 2018-03-15 DIAGNOSIS — E785 Hyperlipidemia, unspecified: Secondary | ICD-10-CM | POA: Diagnosis not present

## 2018-03-15 DIAGNOSIS — I2510 Atherosclerotic heart disease of native coronary artery without angina pectoris: Secondary | ICD-10-CM | POA: Diagnosis not present

## 2018-03-23 ENCOUNTER — Ambulatory Visit
Admission: RE | Admit: 2018-03-23 | Discharge: 2018-03-23 | Disposition: A | Discharge status: Home / Self Care | Payer: BLUE CROSS/BLUE SHIELD | Source: Ambulatory Visit | Attending: Internal Medicine | Admitting: Internal Medicine

## 2018-03-23 DIAGNOSIS — R918 Other nonspecific abnormal finding of lung field: Secondary | ICD-10-CM | POA: Diagnosis not present

## 2018-04-25 DIAGNOSIS — E785 Hyperlipidemia, unspecified: Secondary | ICD-10-CM | POA: Diagnosis not present

## 2018-06-08 DIAGNOSIS — H524 Presbyopia: Secondary | ICD-10-CM | POA: Diagnosis not present

## 2018-06-08 DIAGNOSIS — E119 Type 2 diabetes mellitus without complications: Secondary | ICD-10-CM | POA: Diagnosis not present

## 2018-06-19 DIAGNOSIS — E119 Type 2 diabetes mellitus without complications: Secondary | ICD-10-CM | POA: Diagnosis not present

## 2018-06-19 DIAGNOSIS — I129 Hypertensive chronic kidney disease with stage 1 through stage 4 chronic kidney disease, or unspecified chronic kidney disease: Secondary | ICD-10-CM | POA: Diagnosis not present

## 2018-06-19 DIAGNOSIS — E1165 Type 2 diabetes mellitus with hyperglycemia: Secondary | ICD-10-CM | POA: Diagnosis not present

## 2018-06-19 DIAGNOSIS — E785 Hyperlipidemia, unspecified: Secondary | ICD-10-CM | POA: Diagnosis not present

## 2018-07-16 ENCOUNTER — Encounter: Payer: BLUE CROSS/BLUE SHIELD | Attending: Internal Medicine | Admitting: Registered"

## 2018-07-16 ENCOUNTER — Encounter: Payer: Self-pay | Admitting: Registered"

## 2018-07-16 DIAGNOSIS — E669 Obesity, unspecified: Secondary | ICD-10-CM | POA: Diagnosis not present

## 2018-07-16 DIAGNOSIS — E119 Type 2 diabetes mellitus without complications: Secondary | ICD-10-CM | POA: Diagnosis not present

## 2018-07-16 DIAGNOSIS — Z713 Dietary counseling and surveillance: Secondary | ICD-10-CM | POA: Diagnosis not present

## 2018-07-16 NOTE — Progress Notes (Signed)
Diabetes Self-Management Education  Visit Type: First/Initial  Appt. Start Time: 1600 Appt. End Time: 7106  07/17/2018  [name], identified by name and date of birth, is a 67 y.o. male with a diagnosis of Diabetes: Type 2.   This patient is accompanied in the office by his step son who is a Physiological scientist.  ASSESSMENT Pt states since his last MD visit he has started back into diet and lifestyle routines that were helpful in the past to keep his BG managed. Pt states FBG are usually in the 130s occasionally reaching 150s usually after pizza for dinner or a lot of snacking on carbs before bed. Pt states during the day his pre-meal numbers have been 80-90s and post meals in the 140-150 range. Pt has been reading about DM management from reliable on-line resources such as the ADA.  Pt states he knows that if he could stop smoking that would be the best thing for his health. Pt states he has quit several times before and is planning to really focus on that after he retires in 4 months. Pt states part of the smoking that helps him with stress at work is having to take a smoke break. Pt is planning to use Chantix again to help make it stick this time.  Pt states he has slowly been developing better lifestyle habits since he remarried 6 years ago. Pt states his wife and step-son Paediatric nurse) are good support for him. Pt states he really enjoys sweets and pizza and it can be hard for him to stop eating at a reasonable serving.  Physical Activity: 3 days week (weekends and 1 day during the week) 5 min warmup; 30 min resistance; 10-20 min elliptical. Pt states sometimes he uses the gym at work during lunch hour if he is not having to work through lunch.   Diabetes Self-Management Education - 07/16/18 1546      Visit Information   Visit Type  First/Initial      Initial Visit   Diabetes Type  Type 2    Are you currently following a meal plan?  No    Are you taking your medications as  prescribed?  Not on Medications    Date Diagnosed  3 yrs ago      Health Coping   How would you rate your overall health?  Excellent      Psychosocial Assessment   Patient Belief/Attitude about Diabetes  Motivated to manage diabetes    How often do you need to have someone help you when you read instructions, pamphlets, or other written materials from your doctor or pharmacy?  1 - Never    What is the last grade level you completed in school?  college      Complications   Last HgB A1C per patient/outside source  6.5 %    How often do you check your blood sugar?  1-2 times/day    Have you had a dilated eye exam in the past 12 months?  Yes    Have you had a dental exam in the past 12 months?  Yes    Are you checking your feet?  No      Dietary Intake   Breakfast  egg muffin OR eggs, ww toast    Snack (morning)  granola, plain [place] yogurt    Lunch  ham sandwich, chips OR hot dog from vendor truck on Mon (no bun), sugar-free pudding    Snack (afternoon)  banana OR apple,  PB fit OR pretzels    Dinner  chicken, broccoli, brown rice    Snack (evening)  oreo thin (5 g), milk OR none    Beverage(s)  water ("not enough"), wine or beer ~2 servings      Exercise   Exercise Type  Moderate (swimming / aerobic walking)    How many days per week to you exercise?  3    How many minutes per day do you exercise?  50    Total minutes per week of exercise  150      Patient Education   Previous Diabetes Education  No    Disease state   Definition of diabetes, type 1 and 2, and the diagnosis of diabetes    Nutrition management   Role of diet in the treatment of diabetes and the relationship between the three main macronutrients and blood glucose level;Carbohydrate counting;Food label reading, portion sizes and measuring food.    Physical activity and exercise   Role of exercise on diabetes management, blood pressure control and cardiac health.    Medications  Reviewed patients medication for  diabetes, action, purpose, timing of dose and side effects.    Monitoring  Purpose and frequency of SMBG.;Other (comment)   test for morning high FBG   Chronic complications  Relationship between chronic complications and blood glucose control    Psychosocial adjustment  Role of stress on diabetes    Personal strategies to promote health  Review risk of smoking and offered smoking cessation      Individualized Goals (developed by patient)   Nutrition  General guidelines for healthy choices and portions discussed    Physical Activity  Exercise 3-5 times per week    Monitoring   test blood glucose before bed, 3 a.m., and fasting;test my blood glucose as discussed    Reducing Risk  stop smoking      Outcomes   Expected Outcomes  Demonstrated interest in learning. Expect positive outcomes    Future DMSE  PRN    Program Status  Completed      Individualized Plan for Diabetes Self-Management Training:   Learning Objective:  Patient will have a greater understanding of diabetes self-management. Patient education plan is to attend individual and/or group sessions per assessed needs and concerns.   Patient Instructions  Consider eating balanced meals and snacks Continue with your exercise at least 3 days per week with the balanced of aerobic and strength training. Think about finding your ways to help keep your stress levels down. Remember to get carbs with lunch, complex if possible to help reduce evening sweet cravings. For your morning high blood sugar you can try the test to see what might be going on Diabetes book online: http://diabetes.ada-ksw.com/   Expected Outcomes:  Demonstrated interest in learning. Expect positive outcomes  Education material provided: ADA Diabetes: Your Take Control Guide, A1c chart  If problems or questions, patient to contact team via:  Phone  Future DSME appointment: PRN

## 2018-07-16 NOTE — Patient Instructions (Addendum)
Consider eating balanced meals and snacks Continue with your exercise at least 3 days per week with the balanced of aerobic and strength training. Think about finding your ways to help keep your stress levels down. Remember to get carbs with lunch, complex if possible to help reduce evening sweet cravings. For your morning high blood sugar you can try the test to see what might be going on Diabetes book online: http://diabetes.ada-ksw.com/

## 2018-07-17 DIAGNOSIS — E119 Type 2 diabetes mellitus without complications: Secondary | ICD-10-CM | POA: Insufficient documentation

## 2018-09-13 DIAGNOSIS — E119 Type 2 diabetes mellitus without complications: Secondary | ICD-10-CM | POA: Diagnosis not present

## 2018-09-13 DIAGNOSIS — Z0001 Encounter for general adult medical examination with abnormal findings: Secondary | ICD-10-CM | POA: Diagnosis not present

## 2018-09-13 DIAGNOSIS — E1122 Type 2 diabetes mellitus with diabetic chronic kidney disease: Secondary | ICD-10-CM | POA: Diagnosis not present

## 2018-09-13 DIAGNOSIS — E785 Hyperlipidemia, unspecified: Secondary | ICD-10-CM | POA: Diagnosis not present

## 2018-09-13 DIAGNOSIS — Z1159 Encounter for screening for other viral diseases: Secondary | ICD-10-CM | POA: Diagnosis not present

## 2018-09-13 DIAGNOSIS — Z125 Encounter for screening for malignant neoplasm of prostate: Secondary | ICD-10-CM | POA: Diagnosis not present

## 2018-09-17 DIAGNOSIS — E1122 Type 2 diabetes mellitus with diabetic chronic kidney disease: Secondary | ICD-10-CM | POA: Diagnosis not present

## 2018-09-17 DIAGNOSIS — Z23 Encounter for immunization: Secondary | ICD-10-CM | POA: Diagnosis not present

## 2018-09-17 DIAGNOSIS — I129 Hypertensive chronic kidney disease with stage 1 through stage 4 chronic kidney disease, or unspecified chronic kidney disease: Secondary | ICD-10-CM | POA: Diagnosis not present

## 2018-09-17 DIAGNOSIS — F17200 Nicotine dependence, unspecified, uncomplicated: Secondary | ICD-10-CM | POA: Diagnosis not present

## 2018-09-17 DIAGNOSIS — N400 Enlarged prostate without lower urinary tract symptoms: Secondary | ICD-10-CM | POA: Diagnosis not present

## 2018-09-17 DIAGNOSIS — I2510 Atherosclerotic heart disease of native coronary artery without angina pectoris: Secondary | ICD-10-CM | POA: Diagnosis not present

## 2018-09-24 DIAGNOSIS — I129 Hypertensive chronic kidney disease with stage 1 through stage 4 chronic kidney disease, or unspecified chronic kidney disease: Secondary | ICD-10-CM | POA: Diagnosis not present

## 2018-09-24 DIAGNOSIS — E1165 Type 2 diabetes mellitus with hyperglycemia: Secondary | ICD-10-CM | POA: Diagnosis not present

## 2018-09-24 DIAGNOSIS — E785 Hyperlipidemia, unspecified: Secondary | ICD-10-CM | POA: Diagnosis not present

## 2018-09-24 DIAGNOSIS — E119 Type 2 diabetes mellitus without complications: Secondary | ICD-10-CM | POA: Diagnosis not present

## 2019-02-15 DIAGNOSIS — N401 Enlarged prostate with lower urinary tract symptoms: Secondary | ICD-10-CM | POA: Diagnosis not present

## 2019-02-15 DIAGNOSIS — R3915 Urgency of urination: Secondary | ICD-10-CM | POA: Diagnosis not present

## 2019-02-15 DIAGNOSIS — N5201 Erectile dysfunction due to arterial insufficiency: Secondary | ICD-10-CM | POA: Diagnosis not present

## 2019-06-11 DIAGNOSIS — H524 Presbyopia: Secondary | ICD-10-CM | POA: Diagnosis not present

## 2019-06-14 DIAGNOSIS — Z23 Encounter for immunization: Secondary | ICD-10-CM | POA: Diagnosis not present

## 2019-09-20 DIAGNOSIS — I129 Hypertensive chronic kidney disease with stage 1 through stage 4 chronic kidney disease, or unspecified chronic kidney disease: Secondary | ICD-10-CM | POA: Diagnosis not present

## 2019-09-20 DIAGNOSIS — Z125 Encounter for screening for malignant neoplasm of prostate: Secondary | ICD-10-CM | POA: Diagnosis not present

## 2019-09-20 DIAGNOSIS — E785 Hyperlipidemia, unspecified: Secondary | ICD-10-CM | POA: Diagnosis not present

## 2019-09-20 DIAGNOSIS — E1122 Type 2 diabetes mellitus with diabetic chronic kidney disease: Secondary | ICD-10-CM | POA: Diagnosis not present

## 2019-09-24 ENCOUNTER — Other Ambulatory Visit: Payer: Self-pay | Admitting: Internal Medicine

## 2019-09-24 DIAGNOSIS — K219 Gastro-esophageal reflux disease without esophagitis: Secondary | ICD-10-CM | POA: Diagnosis not present

## 2019-09-24 DIAGNOSIS — I2510 Atherosclerotic heart disease of native coronary artery without angina pectoris: Secondary | ICD-10-CM | POA: Diagnosis not present

## 2019-09-24 DIAGNOSIS — N400 Enlarged prostate without lower urinary tract symptoms: Secondary | ICD-10-CM | POA: Diagnosis not present

## 2019-09-24 DIAGNOSIS — F17210 Nicotine dependence, cigarettes, uncomplicated: Secondary | ICD-10-CM

## 2019-09-24 DIAGNOSIS — N529 Male erectile dysfunction, unspecified: Secondary | ICD-10-CM | POA: Diagnosis not present

## 2019-10-01 ENCOUNTER — Ambulatory Visit: Payer: BLUE CROSS/BLUE SHIELD

## 2019-10-02 ENCOUNTER — Ambulatory Visit
Admission: RE | Admit: 2019-10-02 | Discharge: 2019-10-02 | Disposition: A | Discharge status: Home / Self Care | Payer: BLUE CROSS/BLUE SHIELD | Source: Ambulatory Visit | Attending: Internal Medicine | Admitting: Internal Medicine

## 2019-10-02 DIAGNOSIS — F17210 Nicotine dependence, cigarettes, uncomplicated: Secondary | ICD-10-CM | POA: Diagnosis not present

## 2020-04-29 DIAGNOSIS — L821 Other seborrheic keratosis: Secondary | ICD-10-CM | POA: Diagnosis not present

## 2020-07-22 DIAGNOSIS — N5201 Erectile dysfunction due to arterial insufficiency: Secondary | ICD-10-CM | POA: Diagnosis not present

## 2020-07-22 DIAGNOSIS — N401 Enlarged prostate with lower urinary tract symptoms: Secondary | ICD-10-CM | POA: Diagnosis not present

## 2020-07-22 DIAGNOSIS — N486 Induration penis plastica: Secondary | ICD-10-CM | POA: Diagnosis not present

## 2020-07-22 DIAGNOSIS — R351 Nocturia: Secondary | ICD-10-CM | POA: Diagnosis not present

## 2020-08-05 ENCOUNTER — Ambulatory Visit: Payer: Medicare Other | Admitting: Student

## 2020-08-05 ENCOUNTER — Other Ambulatory Visit: Payer: Self-pay

## 2020-08-05 ENCOUNTER — Encounter: Payer: Self-pay | Admitting: Student

## 2020-08-05 VITALS — BP 134/82 | HR 85 | Resp 16 | Ht 70.0 in | Wt 186.0 lb

## 2020-08-05 DIAGNOSIS — Z720 Tobacco use: Secondary | ICD-10-CM

## 2020-08-05 DIAGNOSIS — I2510 Atherosclerotic heart disease of native coronary artery without angina pectoris: Secondary | ICD-10-CM

## 2020-08-05 DIAGNOSIS — Z20822 Contact with and (suspected) exposure to covid-19: Secondary | ICD-10-CM

## 2020-08-05 DIAGNOSIS — I252 Old myocardial infarction: Secondary | ICD-10-CM

## 2020-08-05 DIAGNOSIS — I200 Unstable angina: Secondary | ICD-10-CM | POA: Diagnosis present

## 2020-08-05 DIAGNOSIS — E7800 Pure hypercholesterolemia, unspecified: Secondary | ICD-10-CM

## 2020-08-05 DIAGNOSIS — R0789 Other chest pain: Secondary | ICD-10-CM | POA: Diagnosis not present

## 2020-08-05 DIAGNOSIS — R079 Chest pain, unspecified: Secondary | ICD-10-CM | POA: Diagnosis not present

## 2020-08-05 DIAGNOSIS — I209 Angina pectoris, unspecified: Secondary | ICD-10-CM

## 2020-08-05 DIAGNOSIS — Z01812 Encounter for preprocedural laboratory examination: Secondary | ICD-10-CM

## 2020-08-05 DIAGNOSIS — I10 Essential (primary) hypertension: Secondary | ICD-10-CM

## 2020-08-05 DIAGNOSIS — E119 Type 2 diabetes mellitus without complications: Secondary | ICD-10-CM

## 2020-08-05 DIAGNOSIS — E1122 Type 2 diabetes mellitus with diabetic chronic kidney disease: Secondary | ICD-10-CM | POA: Diagnosis not present

## 2020-08-05 DIAGNOSIS — I129 Hypertensive chronic kidney disease with stage 1 through stage 4 chronic kidney disease, or unspecified chronic kidney disease: Secondary | ICD-10-CM | POA: Diagnosis not present

## 2020-08-05 MED ORDER — NITROGLYCERIN 0.4 MG SL SUBL: 0.4000 mg | SUBLINGUAL_TABLET | SUBLINGUAL | 3 refills | Status: DC | PRN

## 2020-08-05 NOTE — Progress Notes (Signed)
Patient referred by [name], MD for chest pain.   Subjective:   Adrian Baird, male    DOB: 1951/08/03, 69 y.o.   MRN: [id number]  Chief Complaint  Patient presents with  . Chest Pain  . Follow-up    HPI  69 y.o. caucasian male with history of type 2 diabetes mellitus, hypertension, chronic kidney disease, tobacco use, hypercholesterolemia, and abdominal aortic atherosclerosis and coronary atherosclerosis noted on CT. Patient also has family history of early CAD.   Patient is referred to our office for evaluation of chest pain.  Patient reports onset of chest pressure Sunday (08/02/2020) night, which he suspected to be related to indigestion.  This chest pressure continued through Monday night, and slightly improved.  Patient felt well enough on Tuesday to golf, however after golfing chest pain reoccurred, and continued to today. Patient did have an episode of dyspnea yesterday. He otherwise denies associated symptoms. Denies palpitations, dizziness, nausea, emesis, syncope, near-syncope.  Patient was seen in his PCP office earlier this morning who sent him to our office for urgent evaluation due to concerning symptoms.    Past Medical History:  Diagnosis Date  . Decreased libido   . Diverticulosis   . ED (erectile dysfunction)   . Elevated PSA    recently biopsied, seeing Dr. Jasmine December at [facility]  . Hyperplastic colon polyp   . Hypogonadism male   . Kidney stones 2007  . Personal history of urinary calculi   . RUQ abdominal pain     Past Surgical History:  Procedure Laterality Date  . APPENDECTOMY    . CYSTOSCOPY W/ URETEROSCOPY     hx of  . INGUINAL HERNIA REPAIR     [place] SURGERY  2007   ?laser, possibly lithotripsy  . KIDNEY STONE SURGERY  2007   ?laser, possibly lithotripsy  . TONSILLECTOMY  age 43    Social History   Tobacco Use  Smoking Status Current Some Day Smoker  . Packs/day: 0.25  . Types: Cigarettes  Smokeless Tobacco Never  Used  Tobacco Comment   Trying to quit     Social History   Substance and Sexual Activity  Alcohol Use Yes  . Alcohol/week: 14.0 standard drinks  . Types: 7 Glasses of wine, 7 Cans of beer per week   Comment: 1-2 drinks per day    Family History  Problem Relation Age of Onset  . Stroke Mother 29  . Heart disease Mother 8  . Prostate cancer Father 64  . Heart disease Father 74  . Hyperlipidemia Father   . Stroke Sister   . Prostate cancer Cousin   . Lung cancer Maternal Uncle   . Breast cancer Maternal Aunt        mid-40's  . Prostate cancer Paternal Uncle   . Colon cancer Neg Hx     Current Outpatient Medications on File Prior to Visit  Medication Sig Dispense Refill  . aspirin EC 81 MG tablet Take 1 tablet (81 mg total) by mouth daily.    Marland Kitchen ibuprofen (ADVIL,MOTRIN) 200 MG tablet Take 1 tablet (200 mg total) by mouth every 6 (six) hours as needed (Pain). 30 tablet 0  . losartan (COZAAR) 100 MG tablet Take 100 mg by mouth every morning.     . Multiple Vitamins-Minerals (MULTIVITAMIN WITH MINERALS) tablet Take 1 tablet by mouth daily.    . promethazine (PHENERGAN) 12.5 MG tablet Take 1 tablet (12.5 mg total) by mouth every 6 (six)  hours as needed for nausea or vomiting. 20 tablet 0  . rosuvastatin (CRESTOR) 10 MG tablet Take 10 mg by mouth daily.    . tamsulosin (FLOMAX) 0.4 MG CAPS Take 0.4 mg by mouth daily.     No current facility-administered medications on file prior to visit.    Cardiovascular and other pertinent studies:   EKG 08/05/2020:  Sinus rhythm with first-degree AV block at a rate of 83 bpm.  Normal axis.  Inferior infarct, age indeterminate.   CT chest 10/03/2019:  Cardiovascular: Aortic atherosclerosis. Normal heart size, without pericardial effusion. Multivessel coronary artery atherosclerosis.   Recent labs: 08/05/2020: Glucose 144, BUN/Cr 19/1.00. EGFR 78. Na/K 139/4.8. Rest of the CMP normal H/H 15.5/45.0. MCV 91.5. Platelets 159 HbA1C  6.5% Chol 130, TG 117, HDL 48, LDL 61   Review of Systems  Constitutional: Negative for malaise/fatigue and weight gain.  Cardiovascular: Positive for chest pain. Negative for claudication, leg swelling, near-syncope, orthopnea, palpitations, paroxysmal nocturnal dyspnea and syncope.  Respiratory: Positive for shortness of breath.   Hematologic/Lymphatic: Does not bruise/bleed easily.  Gastrointestinal: Negative for melena.  Neurological: Negative for dizziness and weakness.       Vitals:   08/05/20 1524  BP: 134/82  Pulse: 85  Resp: 16  SpO2: 98%     Body mass index is 26.69 kg/m. Filed Weights   08/05/20 1524  Weight: 186 lb (84.4 kg)    Objective:   Physical Exam Vitals reviewed.  HENT:     Head: Normocephalic and atraumatic.  Cardiovascular:     Rate and Rhythm: Normal rate and regular rhythm.     Pulses: Intact distal pulses.     Heart sounds: S1 normal and S2 normal. No murmur heard. No gallop.   Pulmonary:     Effort: Pulmonary effort is normal. No respiratory distress.     Breath sounds: No wheezing, rhonchi or rales.  Musculoskeletal:     Right lower leg: No edema.     Left lower leg: No edema.  Neurological:     Mental Status: He is alert.        Assessment & Recommendations:   69 y.o. caucasian male with history of type 2 diabetes mellitus, hypertension, chronic kidney disease, tobacco use, hypercholesterolemia, and abdominal aortic atherosclerosis and coronary atherosclerosis noted on CT. Patient also has family history of early CAD.    Angina pectoris: Patient with continued symptoms of chest pressure and EKG today showing inferior infarct of indeterminate age.  CT of the chest from February 2021 showed multivessel coronary atherosclerosis and patient has multiple cardiovascular risk factors.  In view of EKG findings and continued symptoms recommend patient undergo urgent cardiac catheterization. Discussed with patient regarding going to the  emergency department vs cath in tomorrow morning. As patient is without clinical signs of heart failure or arrhythmia shared decision was to undergo cath tomorrow morning. Advised patient tot avoid any unnecessary or strenuous activity, he verbalized understanding. S/L NTG was prescribed and explained how to and when to use it. Interaction with cialis-like agents was discussed.  Patient's PCP did labs this morning which have been reviewed and uploaded to his chart.   Evidence of prior myocardial infarction on EKG: See above recommendations   Coronary artery atherosclerosis noted on CT: Patient scheduled for cardiac catheterization. Recommend management of cardiovascular risk factors.  Continue aspirin   Hypercholesterolemia: I have personally reviewed external labs, lipids are well controlled. Continue Crestor   Hypertension:  Well controlled currently.  Continue losartan  Diabetes mellitus: Management by PCP.  Discussed regarding importance of diabetes control to reduce cardiovascular risks.   Tobacco use: Counseled patient regarding smoking cessation. Patient plans to speak with his PCP about trying to quit smoking with Chantix again at his next appointment with PCP.    Patient was seen in collaboration with Dr. Virgina Jock. He also reviewed patient's chart and examined the patient. Dr. Virgina Jock is in agreement of the plan.    Alethia Berthold, PA-C 08/05/2020, 5:17 PM Office: [phone]

## 2020-08-05 NOTE — Progress Notes (Signed)
69 year old Caucasian male with tobacco dependence, controlled hypertension, hyperlipidemia, strong family history of CAD, exertional chest pain.  Patient had an episode of chest pressure, indigestion by the patient, on 12/25 night, continued all night into next day. He has continued to have chest pressure symptoms with minimal exertion. Physical exam is normal. No appreciable murmur to suggest VSD. EKG shows inferior infarct, age indeterminate. CT chest from 09/2019 shows multivessel coronary atherosclerosis.   Given absence of heart failure or arrhtymia currently, it is reasonable to forego hospital/ED admission tonight, and instead plan to do an urgent cath tomorrow morning.    Elder Negus, MD Pager: [phone] Office: [phone]

## 2020-08-06 ENCOUNTER — Other Ambulatory Visit (HOSPITAL_COMMUNITY): Payer: Self-pay | Admitting: Cardiology

## 2020-08-06 ENCOUNTER — Other Ambulatory Visit: Payer: Self-pay

## 2020-08-06 ENCOUNTER — Ambulatory Visit (HOSPITAL_COMMUNITY)
Admission: AD | Admit: 2020-08-06 | Discharge: 2020-08-06 | Disposition: A | Discharge status: Home / Self Care | Payer: Medicare Other | Source: Ambulatory Visit | Attending: Cardiology | Admitting: Cardiology

## 2020-08-06 ENCOUNTER — Ambulatory Visit (HOSPITAL_COMMUNITY): Payer: Medicare Other

## 2020-08-06 ENCOUNTER — Ambulatory Visit (HOSPITAL_COMMUNITY)
Admission: AD | Disposition: A | Discharge status: Home / Self Care | Payer: Self-pay | Source: Ambulatory Visit | Attending: Cardiology

## 2020-08-06 DIAGNOSIS — I25110 Atherosclerotic heart disease of native coronary artery with unstable angina pectoris: Secondary | ICD-10-CM | POA: Diagnosis not present

## 2020-08-06 DIAGNOSIS — Z79899 Other long term (current) drug therapy: Secondary | ICD-10-CM | POA: Insufficient documentation

## 2020-08-06 DIAGNOSIS — Z20822 Contact with and (suspected) exposure to covid-19: Secondary | ICD-10-CM | POA: Diagnosis not present

## 2020-08-06 DIAGNOSIS — Z7982 Long term (current) use of aspirin: Secondary | ICD-10-CM | POA: Diagnosis not present

## 2020-08-06 DIAGNOSIS — I200 Unstable angina: Secondary | ICD-10-CM | POA: Diagnosis present

## 2020-08-06 DIAGNOSIS — F17210 Nicotine dependence, cigarettes, uncomplicated: Secondary | ICD-10-CM | POA: Insufficient documentation

## 2020-08-06 DIAGNOSIS — Z8249 Family history of ischemic heart disease and other diseases of the circulatory system: Secondary | ICD-10-CM | POA: Insufficient documentation

## 2020-08-06 DIAGNOSIS — E7800 Pure hypercholesterolemia, unspecified: Secondary | ICD-10-CM | POA: Diagnosis not present

## 2020-08-06 DIAGNOSIS — I2510 Atherosclerotic heart disease of native coronary artery without angina pectoris: Secondary | ICD-10-CM | POA: Diagnosis present

## 2020-08-06 DIAGNOSIS — I10 Essential (primary) hypertension: Secondary | ICD-10-CM | POA: Diagnosis not present

## 2020-08-06 DIAGNOSIS — I700 Atherosclerosis of aorta: Secondary | ICD-10-CM | POA: Insufficient documentation

## 2020-08-06 HISTORY — PX: LEFT HEART CATH AND CORONARY ANGIOGRAPHY: CATH118249

## 2020-08-06 HISTORY — PX: CORONARY BALLOON ANGIOPLASTY: CATH118233

## 2020-08-06 LAB — CBC
HCT: 42.6 % (ref 39.0–52.0)
Hemoglobin: 15.2 g/dL (ref 13.0–17.0)
MCH: 32.6 pg (ref 26.0–34.0)
MCHC: 35.7 g/dL (ref 30.0–36.0)
MCV: 91.4 fL (ref 80.0–100.0)
Platelets: 130 10*3/uL — ABNORMAL LOW (ref 150–400)
RBC: 4.66 MIL/uL (ref 4.22–5.81)
RDW: 12.4 % (ref 11.5–15.5)
WBC: 8.1 10*3/uL (ref 4.0–10.5)
nRBC: 0 % (ref 0.0–0.2)

## 2020-08-06 LAB — ECHOCARDIOGRAM COMPLETE
Area-P 1/2: 2.97 cm2
Height: 71 in
S' Lateral: 3.1 cm
Weight: 2976 oz

## 2020-08-06 LAB — POCT ACTIVATED CLOTTING TIME
Activated Clotting Time: 327 seconds
Activated Clotting Time: 452 seconds

## 2020-08-06 LAB — BASIC METABOLIC PANEL
Anion gap: 9 (ref 5–15)
BUN: 15 mg/dL (ref 8–23)
CO2: 23 mmol/L (ref 22–32)
Calcium: 9.3 mg/dL (ref 8.9–10.3)
Chloride: 104 mmol/L (ref 98–111)
Creatinine, Ser: 1.01 mg/dL (ref 0.61–1.24)
GFR, Estimated: >60 mL/min (ref >60)
Glucose, Bld: 151 mg/dL — ABNORMAL HIGH (ref 70–99)
Potassium: 3.8 mmol/L (ref 3.5–5.1)
Sodium: 136 mmol/L (ref 135–145)

## 2020-08-06 LAB — SARS CORONAVIRUS 2 BY RT PCR (HOSPITAL ORDER, PERFORMED IN ~~LOC~~ HOSPITAL LAB): SARS Coronavirus 2: NEGATIVE

## 2020-08-06 SURGERY — LEFT HEART CATH AND CORONARY ANGIOGRAPHY
Anesthesia: LOCAL

## 2020-08-06 MED ORDER — LIDOCAINE HCL (PF) 1 % IJ SOLN
INTRAMUSCULAR | Status: DC | PRN
  Administered 2020-08-06: 2 mL

## 2020-08-06 MED ORDER — LIDOCAINE HCL (PF) 1 % IJ SOLN
INTRAMUSCULAR | Status: AC
  Filled 2020-08-06: qty 30

## 2020-08-06 MED ORDER — METOPROLOL SUCCINATE ER 25 MG PO TB24
25.0000 mg | ORAL_TABLET | Freq: Every day | ORAL | Status: DC
  Administered 2020-08-06: 25 mg via ORAL
  Filled 2020-08-06: qty 1

## 2020-08-06 MED ORDER — TICAGRELOR 90 MG PO TABS: 90.0000 mg | ORAL_TABLET | Freq: Two times a day (BID) | ORAL | Status: DC

## 2020-08-06 MED ORDER — SODIUM CHLORIDE 0.9 % IV SOLN: 250.0000 mL | INTRAVENOUS | Status: DC | PRN

## 2020-08-06 MED ORDER — HEPARIN SODIUM (PORCINE) 1000 UNIT/ML IJ SOLN
INTRAMUSCULAR | Status: DC | PRN
  Administered 2020-08-06: 6000 [IU] via INTRAVENOUS
  Administered 2020-08-06: 4000 [IU] via INTRAVENOUS

## 2020-08-06 MED ORDER — ANGIOPLASTY BOOK
Status: AC
  Filled 2020-08-06: qty 1

## 2020-08-06 MED ORDER — NITROGLYCERIN 1 MG/10 ML FOR IR/CATH LAB
INTRA_ARTERIAL | Status: AC
  Filled 2020-08-06: qty 10

## 2020-08-06 MED ORDER — TICAGRELOR 90 MG PO TABS
ORAL_TABLET | ORAL | Status: AC
  Filled 2020-08-06: qty 2

## 2020-08-06 MED ORDER — LABETALOL HCL 5 MG/ML IV SOLN: 10.0000 mg | INTRAVENOUS | Status: DC | PRN

## 2020-08-06 MED ORDER — IOHEXOL 350 MG/ML SOLN
INTRAVENOUS | Status: DC | PRN
  Administered 2020-08-06: 145 mL

## 2020-08-06 MED ORDER — SODIUM CHLORIDE 0.9% FLUSH: 3.0000 mL | Freq: Two times a day (BID) | INTRAVENOUS | Status: DC

## 2020-08-06 MED ORDER — VERAPAMIL HCL 2.5 MG/ML IV SOLN
INTRAVENOUS | Status: DC | PRN
  Administered 2020-08-06: 10 mL via INTRA_ARTERIAL

## 2020-08-06 MED ORDER — MIDAZOLAM HCL 2 MG/2ML IJ SOLN
INTRAMUSCULAR | Status: DC | PRN
  Administered 2020-08-06: 1 mg via INTRAVENOUS
  Administered 2020-08-06: 2 mg via INTRAVENOUS

## 2020-08-06 MED ORDER — ACETAMINOPHEN 325 MG PO TABS: 650.0000 mg | ORAL_TABLET | ORAL | Status: DC | PRN

## 2020-08-06 MED ORDER — ONDANSETRON HCL 4 MG/2ML IJ SOLN: 4.0000 mg | Freq: Four times a day (QID) | INTRAMUSCULAR | Status: DC | PRN

## 2020-08-06 MED ORDER — VERAPAMIL HCL 2.5 MG/ML IV SOLN
INTRAVENOUS | Status: AC
  Filled 2020-08-06: qty 2

## 2020-08-06 MED ORDER — SODIUM CHLORIDE 0.9 % WEIGHT BASED INFUSION
3.0000 mL/kg/h | INTRAVENOUS | Status: AC
  Administered 2020-08-06: 3 mL/kg/h via INTRAVENOUS

## 2020-08-06 MED ORDER — HEPARIN SODIUM (PORCINE) 1000 UNIT/ML IJ SOLN
INTRAMUSCULAR | Status: AC
  Filled 2020-08-06: qty 1

## 2020-08-06 MED ORDER — MIDAZOLAM HCL 2 MG/2ML IJ SOLN
INTRAMUSCULAR | Status: AC
  Filled 2020-08-06: qty 2

## 2020-08-06 MED ORDER — FENTANYL CITRATE (PF) 100 MCG/2ML IJ SOLN
INTRAMUSCULAR | Status: AC
  Filled 2020-08-06: qty 2

## 2020-08-06 MED ORDER — HYDRALAZINE HCL 20 MG/ML IJ SOLN: 10.0000 mg | INTRAMUSCULAR | Status: DC | PRN

## 2020-08-06 MED ORDER — SODIUM CHLORIDE 0.9 % WEIGHT BASED INFUSION: 1.0000 mL/kg/h | INTRAVENOUS | Status: DC

## 2020-08-06 MED ORDER — SODIUM CHLORIDE 0.9% FLUSH: 3.0000 mL | INTRAVENOUS | Status: DC | PRN

## 2020-08-06 MED ORDER — ASPIRIN 81 MG PO CHEW: 81.0000 mg | CHEWABLE_TABLET | ORAL | Status: DC

## 2020-08-06 MED ORDER — SODIUM CHLORIDE 0.9 % IV SOLN: INTRAVENOUS | Status: DC

## 2020-08-06 MED ORDER — FENTANYL CITRATE (PF) 100 MCG/2ML IJ SOLN
INTRAMUSCULAR | Status: DC | PRN
  Administered 2020-08-06 (×2): 50 ug via INTRAVENOUS

## 2020-08-06 MED ORDER — TICAGRELOR 90 MG PO TABS: 90.0000 mg | ORAL_TABLET | Freq: Two times a day (BID) | ORAL | 1 refills | Status: DC

## 2020-08-06 MED ORDER — METOPROLOL SUCCINATE ER 25 MG PO TB24: 25.0000 mg | ORAL_TABLET | Freq: Every day | ORAL | 1 refills | Status: DC

## 2020-08-06 MED ORDER — HEPARIN (PORCINE) IN NACL 1000-0.9 UT/500ML-% IV SOLN
INTRAVENOUS | Status: DC | PRN
  Administered 2020-08-06 (×2): 500 mL

## 2020-08-06 MED ORDER — HEPARIN (PORCINE) IN NACL 1000-0.9 UT/500ML-% IV SOLN
INTRAVENOUS | Status: AC
  Filled 2020-08-06: qty 1000

## 2020-08-06 MED ORDER — TICAGRELOR 90 MG PO TABS
ORAL_TABLET | ORAL | Status: DC | PRN
  Administered 2020-08-06: 180 mg via ORAL

## 2020-08-06 MED FILL — METOPROLOL SUCCINATE ER 25: 25 | 30 days supply | Qty: 30 | Fill #0

## 2020-08-06 MED FILL — BRILINTA 90 MG TABLET: 90 | 30 days supply | Qty: 60 | Fill #0

## 2020-08-06 SURGICAL SUPPLY — 24 items
BALLN SAPPHIRE 2.0X12 (BALLOONS) ×2
BALLN SAPPHIRE 2.0X15 (BALLOONS) ×2
BALLOON SAPPHIRE 2.0X12 (BALLOONS) IMPLANT
BALLOON SAPPHIRE 2.0X15 (BALLOONS) IMPLANT
CATH 5FR JL3.5 JR4 ANG PIG MP (CATHETERS) ×1 IMPLANT
CATH LAUNCHER 6FR AL1 (CATHETERS) IMPLANT
CATH TELESCOPE 6F GEC (CATHETERS) ×1 IMPLANT
CATH VISTA GUIDE 6FR JR4 (CATHETERS) ×1 IMPLANT
CATHETER LAUNCHER 6FR AL1 (CATHETERS) ×2
DEVICE RAD COMP TR BAND LRG (VASCULAR PRODUCTS) ×1 IMPLANT
GLIDESHEATH SLEND A-KIT 6F 22G (SHEATH) ×1 IMPLANT
GUIDEWIRE INQWIRE 1.5J.035X260 (WIRE) IMPLANT
INQWIRE 1.5J .035X260CM (WIRE) ×2
KIT ENCORE 26 ADVANTAGE (KITS) ×1 IMPLANT
KIT HEART LEFT (KITS) ×2 IMPLANT
KIT HEMO VALVE WATCHDOG (MISCELLANEOUS) ×1 IMPLANT
PACK CARDIAC CATHETERIZATION (CUSTOM PROCEDURE TRAY) ×2 IMPLANT
SHEATH PROBE COVER 6X72 (BAG) ×1 IMPLANT
TRANSDUCER W/STOPCOCK (MISCELLANEOUS) ×2 IMPLANT
TUBING CIL FLEX 10 FLL-RA (TUBING) ×2 IMPLANT
WIRE ASAHI FIELDER XT 190CM (WIRE) ×1 IMPLANT
WIRE ASAHI PROWATER 180CM (WIRE) ×1 IMPLANT
WIRE COUGAR XT STRL 190CM (WIRE) ×1 IMPLANT
WIRE HI TORQ WHISPER MS 190CM (WIRE) ×1 IMPLANT

## 2020-08-06 NOTE — Progress Notes (Signed)
CARDIAC REHAB PHASE I   Post-cath education completed with pt. Reviewed importance of risk factor modifications. Pt given heart healthy and diabetic diets. Reviewed site care, restrictions, exercise guidelines and importance of smoking cessation. Pt states he feels like this was a wake up call and wants to stick around so is motivated to make necessary changes. Pt denies further questions or concerns at this time. Currently does not meet criteria for CRP II referral, but can speak with MD if interested.  [id number] Reynold Bowen, RN BSN 08/06/2020 2:43 PM

## 2020-08-06 NOTE — Interval H&P Note (Signed)
History and Physical Interval Note:  08/06/2020 9:37 AM  Adrian Baird  has presented today for surgery, with the diagnosis of chest pain.  The various methods of treatment have been discussed with the patient and family. After consideration of risks, benefits and other options for treatment, the patient has consented to  Procedure(s): LEFT HEART CATH AND CORONARY ANGIOGRAPHY (N/A) as a surgical intervention.  The patient's history has been reviewed, patient examined, no change in status, stable for surgery.  I have reviewed the patient's chart and labs.  Questions were answered to the patient's satisfaction.    2016 Appropriate Use Criteria for Coronary Revascularization in Patients With Acute Coronary Syndrome NSTEMI/UA High Risk (TIMI Score 5-7) NSTEMI/Unstable angina, stabilized patient at high risk Link Here: https://powell.info/ Indication:  Revascularization by PCI or CABG of 1 or more arteries in a patient with NSTEMI or unstable angina with Stabilization after presentation High risk for clinical events  A (7) Indication: 16; Score 7   Bradely Rudin J Ernesto Zukowski

## 2020-08-06 NOTE — H&P (Signed)
OV 08/05/2020 copied for documentation     Patient referred by Dr. Renne Crigler for chest pain.   Subjective:   Adrian Baird, male    DOB: 27-Nov-1950, 69 y.o.   MRN: [id number]  C/C: Chest pain  HPI  69 y.o. caucasian male with history of type 2 diabetes mellitus, hypertension, chronic kidney disease, tobacco use, hypercholesterolemia, and abdominal aortic atherosclerosis and coronary atherosclerosis noted on CT. Patient also has family history of early CAD.   Patient is referred to our office for evaluation of chest pain.  Patient reports onset of chest pressure Sunday (08/02/2020) night, which he suspected to be related to indigestion.  This chest pressure continued through Monday night, and slightly improved.  Patient felt well enough on Tuesday to golf, however after golfing chest pain reoccurred, and continued to today. Patient did have an episode of dyspnea yesterday. He otherwise denies associated symptoms. Denies palpitations, dizziness, nausea, emesis, syncope, near-syncope.  Patient was seen in his PCP office earlier this morning who sent him to our office for urgent evaluation due to concerning symptoms.    Past Medical History:  Diagnosis Date  . Decreased libido   . Diverticulosis   . ED (erectile dysfunction)   . Elevated PSA    recently biopsied, seeing Dr. Margarita Grizzle at [facility]  . Hyperplastic colon polyp   . Hypogonadism male   . Kidney stones 2007  . Personal history of urinary calculi   . RUQ abdominal pain     Past Surgical History:  Procedure Laterality Date  . APPENDECTOMY    . CYSTOSCOPY W/ URETEROSCOPY     hx of  . INGUINAL HERNIA REPAIR     [facility]  . KIDNEY STONE SURGERY  2007   ?laser, possibly lithotripsy  . KIDNEY STONE SURGERY  2007   ?laser, possibly lithotripsy  . TONSILLECTOMY  age 7    Social History   Tobacco Use  Smoking Status Current Some Day Smoker  . Packs/day: 0.25  . Types: Cigarettes  Smokeless Tobacco Never Used  Tobacco Comment    Trying to quit     Social History   Substance and Sexual Activity  Alcohol Use Yes  . Alcohol/week: 14.0 standard drinks  . Types: 7 Glasses of wine, 7 Cans of beer per week   Comment: 1-2 drinks per day    Family History  Problem Relation Age of Onset  . Stroke Mother 47  . Heart disease Mother 78  . Prostate cancer Father 29  . Heart disease Father 28  . Hyperlipidemia Father   . Stroke Sister   . Prostate cancer Cousin   . Lung cancer Maternal Uncle   . Breast cancer Maternal Aunt        mid-40's  . Prostate cancer Paternal Uncle   . Colon cancer Neg Hx     No current facility-administered medications on file prior to encounter.   Current Outpatient Medications on File Prior to Encounter  Medication Sig Dispense Refill  . aspirin EC 81 MG tablet Take 1 tablet (81 mg total) by mouth daily.    . Calcium Citrate (CAL-CITRATE PO) Take 500 mg by mouth daily.    . famotidine (PEPCID) 40 MG tablet Take 40 mg by mouth daily.    Marland Kitchen losartan (COZAAR) 100 MG tablet Take 100 mg by mouth every morning.     . Multiple Vitamins-Minerals (MULTIVITAMIN WITH MINERALS) tablet Take 1 tablet by mouth daily.    . rosuvastatin (CRESTOR) 10 MG tablet Take 10  mg by mouth daily.    . tamsulosin (FLOMAX) 0.4 MG CAPS Take 0.4 mg by mouth daily.    . nitroGLYCERIN (NITROSTAT) 0.4 MG SL tablet Place 1 tablet (0.4 mg total) under the tongue every 5 (five) minutes as needed for chest pain. 90 tablet 3  . tadalafil (CIALIS) 5 MG tablet Take 5 mg by mouth daily as needed for erectile dysfunction.      Cardiovascular and other pertinent studies:   EKG 08/05/2020:  Sinus rhythm with first-degree AV block at a rate of 83 bpm.  Normal axis.  Inferior infarct, age indeterminate.   CT chest 10/03/2019:  Cardiovascular: Aortic atherosclerosis. Normal heart size, without pericardial effusion. Multivessel coronary artery atherosclerosis.   Recent labs: 08/05/2020: Glucose 144, BUN/Cr 19/1.00. EGFR  78. Na/K 139/4.8. Rest of the CMP normal H/H 15.5/45.0. MCV 91.5. Platelets 159 HbA1C 6.5% Chol 130, TG 117, HDL 48, LDL 61   Review of Systems  Constitutional: Negative for malaise/fatigue and weight gain.  Cardiovascular: Positive for chest pain. Negative for claudication, leg swelling, near-syncope, orthopnea, palpitations, paroxysmal nocturnal dyspnea and syncope.  Respiratory: Positive for shortness of breath.   Hematologic/Lymphatic: Does not bruise/bleed easily.  Gastrointestinal: Negative for melena.  Neurological: Negative for dizziness and weakness.       Vitals:   08/06/20 0610  BP: (!) 143/93  Pulse: 78  Resp: 19  Temp: 98.1 F (36.7 C)  SpO2: 98%     Body mass index is 25.94 kg/m. Filed Weights   08/06/20 0610  Weight: 84.4 kg    Objective:   Physical Exam Vitals reviewed.  HENT:     Head: Normocephalic and atraumatic.  Cardiovascular:     Rate and Rhythm: Normal rate and regular rhythm.     Pulses: Intact distal pulses.     Heart sounds: S1 normal and S2 normal. No murmur heard. No gallop.   Pulmonary:     Effort: Pulmonary effort is normal. No respiratory distress.     Breath sounds: No wheezing, rhonchi or rales.  Musculoskeletal:     Right lower leg: No edema.     Left lower leg: No edema.  Neurological:     Mental Status: He is alert.        Assessment & Recommendations:   69 y.o. caucasian male with history of type 2 diabetes mellitus, hypertension, chronic kidney disease, tobacco use, hypercholesterolemia, and abdominal aortic atherosclerosis and coronary atherosclerosis noted on CT. Patient also has family history of early CAD.    Angina pectoris: Patient with continued symptoms of chest pressure and EKG today showing inferior infarct of indeterminate age.  CT of the chest from February 2021 showed multivessel coronary atherosclerosis and patient has multiple cardiovascular risk factors.  In view of EKG findings and continued symptoms  recommend patient undergo urgent cardiac catheterization. Discussed with patient regarding going to the emergency department vs cath in tomorrow morning. As patient is without clinical signs of heart failure or arrhythmia shared decision was to undergo cath tomorrow morning. Advised patient tot avoid any unnecessary or strenuous activity, he verbalized understanding. S/L NTG was prescribed and explained how to and when to use it. Interaction with cialis-like agents was discussed.  Patient's PCP did labs this morning which have been reviewed and uploaded to his chart.   Evidence of prior myocardial infarction on EKG: See above recommendations   Coronary artery atherosclerosis noted on CT: Patient scheduled for cardiac catheterization. Recommend management of cardiovascular risk factors.  Continue aspirin  Hypercholesterolemia: I have personally reviewed external labs, lipids are well controlled. Continue Crestor   Hypertension:  Well controlled currently.  Continue losartan   Diabetes mellitus: Management by PCP.  Discussed regarding importance of diabetes control to reduce cardiovascular risks.   Tobacco use: Counseled patient regarding smoking cessation. Patient plans to speak with his PCP about trying to quit smoking with Chantix again at his next appointment with PCP.    Patient was seen in collaboration with Dr. Virgina Jock. He also reviewed patient's chart and examined the patient. Dr. Virgina Jock is in agreement of the plan.    [place], PA-C 08/06/2020, 9:36 AM Office: [phone]

## 2020-08-06 NOTE — Progress Notes (Signed)
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

## 2020-08-06 NOTE — Progress Notes (Signed)
Called Dr Rosemary Holms and per Dr Rosemary Holms client may be discharged in 4 hours

## 2020-08-06 NOTE — Progress Notes (Signed)
  Echocardiogram 2D Echocardiogram with 3D has been performed.  Adrian Baird 08/06/2020, 9:43 AM

## 2020-08-06 NOTE — Discharge Instructions (Signed)
Coronary Angioplasty, Care After This sheet gives you information about how to care for yourself after your procedure. Your health care provider may also give you more specific instructions. If you have problems or questions, contact your health care provider. What can I expect after the procedure? After your procedure, it is common to have:  Bruising at the catheter insertion site. This usually fades within 1-2 weeks.  Blood collecting in the tissue (hematoma) that may be painful to the touch. It should become smaller and less tender within 1-2 weeks. Follow these instructions at home: Medicines  Take over-the-counter and prescription medicines only as told by your health care provider.  Blood thinners may be prescribed after your procedure to improve blood flow. Bathing  You may shower 24-48 hours after the procedure or as told by your health care provider.  Do not take baths, swim, or use a hot tub until your health care provider approves. Insertion site care   Follow instructions from your health care provider about how to take care of your insertion site. Make sure you: ? Wash your hands with soap and water before you change your bandage (dressing). If soap and water are not available, use hand sanitizer. ? Change your dressing as told by your health care provider. ? Gently wash the site with plain soap and water. ? Use a clean towel to pat the area dry. ? Do not rub the site, because this may cause bleeding. ? Do not apply powder or lotion to the site.  Check your insertion site every day for signs of infection. Check for: ? More redness, swelling, or pain. ? More fluid or blood. ? Warmth. ? Pus or a bad smell. Lifestyle   Make any lifestyle changes as recommended by your health care provider. This may include: ? Not using any products that contain nicotine or tobacco, such as cigarettes and e-cigarettes. If you need help quitting, ask your health care  provider. ? Managing your weight. ? Getting regular exercise. ? Managing your blood pressure. ? Limiting your alcohol intake. ? Managing other health problems, such as diabetes.  Eat a heart-healthy diet. This should include plenty of fresh fruits and vegetables. Avoid foods that are: ? High in salt (sodium). ? Canned or highly processed. ? High in saturated fat or sugar. ? Fried. General instructions  Do not lift over 10 lb (4.5 kg) for 5 days after your procedure or as told by your health care provider.  Ask your health care provider when it is okay to: ? Return to work or school. ? Resume usual physical activities or sports. ? Resume sexual activity.  Keep all follow-up visits as told by your health care provider. This is important. Contact a health care provider if:  You have a fever.  You have chills.  You have increased bleeding from the insertion site. Hold pressure on the site. Get help right away if:  You develop chest pain or shortness of breath, feel faint, or pass out.  You have unusual pain at the insertion site.  You have redness, warmth, or swelling at the insertion site.  You have drainage (other than a small amount of blood on the dressing) from the insertion site.  The insertion site is bleeding, and the bleeding does not stop after 30 minutes of holding steady pressure on the site.  You develop bleeding from any other place, such as from the rectum. There may be bright red blood in your urine or stool, or it  may appear as black, tarry stool. This information is not intended to replace advice given to you by your health care provider. Make sure you discuss any questions you have with your health care provider. Document Revised: 07/07/2017 Document Reviewed: 02/28/2016 Elsevier Patient Education  2020 [place]  This sheet gives you information about how to care for yourself after your procedure. Your health care provider  may also give you more specific instructions. If you have problems or questions, contact your health care provider. What can I expect after the procedure? After the procedure, it is common to have:  Bruising and tenderness at the catheter insertion area. Follow these instructions at home: Medicines  Take over-the-counter and prescription medicines only as told by your health care provider. Insertion site care  Follow instructions from your health care provider about how to take care of your insertion site. Make sure you: ? Wash your hands with soap and water before you change your bandage (dressing). If soap and water are not available, use hand sanitizer. ? Change your dressing as told by your health care provider. ? Leave stitches (sutures), skin glue, or adhesive strips in place. These skin closures may need to stay in place for 2 weeks or longer. If adhesive strip edges start to loosen and curl up, you may trim the loose edges. Do not remove adhesive strips completely unless your health care provider tells you to do that.  Check your insertion site every day for signs of infection. Check for: ? Redness, swelling, or pain. ? Fluid or blood. ? Pus or a bad smell. ? Warmth.  Do not take baths, swim, or use a hot tub until your health care provider approves.  You may shower 24-48 hours after the procedure, or as directed by your health care provider. ? Remove the dressing and gently wash the site with plain soap and water. ? Pat the area dry with a clean towel. ? Do not rub the site. That could cause bleeding.  Do not apply powder or lotion to the site. Activity   For 24 hours after the procedure, or as directed by your health care provider: ? Do not flex or bend the affected arm. ? Do not push or pull heavy objects with the affected arm. ? Do not drive yourself home from the hospital or clinic. You may drive 24 hours after the procedure unless your health care provider tells you  not to. ? Do not operate machinery or power tools.  Do not lift anything that is heavier than 10 lb (4.5 kg), or the limit that you are told, until your health care provider says that it is safe.  Ask your health care provider when it is okay to: ? Return to work or school. ? Resume usual physical activities or sports. ? Resume sexual activity. General instructions  If the catheter site starts to bleed, raise your arm and put firm pressure on the site. If the bleeding does not stop, get help right away. This is a medical emergency.  If you went home on the same day as your procedure, a responsible adult should be with you for the first 24 hours after you arrive home.  Keep all follow-up visits as told by your health care provider. This is important. Contact a health care provider if:  You have a fever.  You have redness, swelling, or yellow drainage around your insertion site. Get help right away if:  You have unusual pain at the radial site.  The catheter insertion area swells very fast.  The insertion area is bleeding, and the bleeding does not stop when you hold steady pressure on the area.  Your arm or hand becomes pale, cool, tingly, or numb. These symptoms may represent a serious problem that is an emergency. Do not wait to see if the symptoms will go away. Get medical help right away. Call your local emergency services (911 in the [place] Do not drive yourself to the hospital. Summary  After the procedure, it is common to have bruising and tenderness at the site.  Follow instructions from your health care provider about how to take care of your radial site wound. Check the wound every day for signs of infection.  Do not lift anything that is heavier than 10 lb (4.5 kg), or the limit that you are told, until your health care provider says that it is safe. This information is not intended to replace advice given to you by your health care provider. Make sure you discuss any  questions you have with your health care provider. Document Revised: 08/30/2017 Document Reviewed: 08/30/2017 Elsevier Patient Education  2020 [employer].

## 2020-08-07 MED FILL — Nitroglycerin IV Soln 100 MCG/ML in D5W: INTRA_ARTERIAL | Qty: 10 | Status: AC

## 2020-08-10 ENCOUNTER — Encounter (HOSPITAL_COMMUNITY): Payer: Self-pay | Admitting: Cardiology

## 2020-08-11 NOTE — Progress Notes (Signed)
Patient referred by Adrian Pretty, MD for chest pain.   Subjective:   Adrian Baird, male    DOB: 14-Sep-1950, 70 y.o.   MRN: [id number]  Chief Complaint  Patient presents with   Follow-up    Cath - No Stent    HPI  70 y.o. caucasian male with history of type 2 diabetes mellitus, hypertension, chronic kidney disease, tobacco use, hypercholesterolemia, and abdominal aortic atherosclerosis and coronary atherosclerosis noted on CT. Patient also has family history of early CAD.   Patient was initially referred to our office for evaluation of chest pain on 08/05/2020. EKG at the time revealed inferior infarct. Therefore patient underwent urgent cardiac catheterization.  Patient presents for 1 week follow up after left heart catheterization. Cardiac catheterizaiton revealed occluded RPDA with faint collaterals filling the distal vessel and OM2 with 80% focal stenosis. PCI to RPDA was attempted but unsuccessful. Dr. Virgina Jock recommended uninterrupted dual antiplatelet therapy with Aspirin 32m daily and Ticagrelor 988mtwice daily.   Patient has had no recurrence of chest pain. He has refrained from smoking since his last office visit 08/05/2020. He has noticed occasional brief shortness of breath at rest since starting [facility]  Past Medical History:  Diagnosis Date   Decreased libido    Diverticulosis    ED (erectile dysfunction)    Elevated PSA    recently biopsied, seeing Dr. [facility]   Hyperplastic colon polyp    Hypogonadism male    Kidney stones 2007   Personal history of urinary calculi    RUQ abdominal pain     Past Surgical History:  Procedure Laterality Date   APPENDECTOMY     CORONARY BALLOON ANGIOPLASTY N/A 08/06/2020   Procedure: CORONARY BALLOON ANGIOPLASTY;  Surgeon: PaNigel MormonMD;  [facility];  Service: Cardiovascular;  Laterality: N/A;   CYSTOSCOPY W/ URETEROSCOPY     hx of   INGUINAL HERNIA REPAIR      [facility]  2007   ?laser, possibly lithotripsy   KIDNEY STONE SURGERY  2007   ?laser, possibly lithotripsy   LEFT HEART CATH AND CORONARY ANGIOGRAPHY N/A 08/06/2020   Procedure: LEFT HEART CATH AND CORONARY ANGIOGRAPHY;  Surgeon: PaNigel MormonMD;  [facility];  Service: Cardiovascular;  Laterality: N/A;   TONSILLECTOMY  age [age]  Social History   Tobacco Use  Smoking Status Former Smoker   Packs/day: 0.25   Types: Cigarettes  Smokeless Tobacco Never Used  Tobacco Comment   Quit after Heart Attack    Social History   Substance and Sexual Activity  Alcohol Use Yes   Alcohol/week: 14.0 standard drinks   Types: 7 Glasses of wine, 7 Cans of beer per week   Comment: 1-2 drinks per day    Family History  Problem Relation Age of Onset   Stroke Mother 8277 Heart disease Mother 8258 Prostate cancer Father 6067 Heart disease Father 5519 Hyperlipidemia Father    Stroke Sister    Prostate cancer Cousin    Lung cancer Maternal Uncle    Breast cancer Maternal Aunt        mid-40's   Prostate cancer Paternal Uncle    Colon cancer Neg Hx     Current Outpatient Medications on File Prior to Visit  Medication Sig Dispense Refill   aspirin EC 81 MG tablet Take 1 tablet (81 mg total) by mouth  daily.     Calcium Citrate (CAL-CITRATE PO) Take 500 mg by mouth daily.     famotidine (PEPCID) 40 MG tablet Take 40 mg by mouth daily.     losartan (COZAAR) 100 MG tablet Take 100 mg by mouth every morning.      Multiple Vitamins-Minerals (MULTIVITAMIN WITH MINERALS) tablet Take 1 tablet by mouth daily.     nitroGLYCERIN (NITROSTAT) 0.4 MG SL tablet Place 1 tablet (0.4 mg total) under the tongue every 5 (five) minutes as needed for chest pain. 90 tablet 3   rosuvastatin (CRESTOR) 10 MG tablet Take 10 mg by mouth daily.     tamsulosin (FLOMAX) 0.4 MG CAPS Take 0.4 mg by mouth daily.     No current facility-administered  medications on file prior to visit.    Cardiovascular and other pertinent studies:  EKG 08/12/2020:  Sinus rhythm with first-degree AV block at a rate of 61 bpm.  Normal axis.  Inferior infarct, age indeterminate. PRWP, cannot exclude anteroseptal infarct old.   Left heart catheterization coronary angiography 08/06/2020: LM: Normal LAD: Short vessel, does not reach apexMid 40%, distal 40% stenoses LCx: Prox 30% stenosis      Small t medium sized OM2 with diffuse disease, and focal 80% stenosis Ramus: Normnal RCA: Large vessel      Prox focal 50% stenosis      Mid 20% stenosis      Occulded RPDA with faint collaterals  filling the distal vessel  Attempted PCI to RPDA Able to wire the vessel but unable to advance further with minimal flow in spite prox PTCA  Fluoro time: 36 min Contrast used: 145 cc  Recommendations: Recommend uninterrupted dual antiplatelet therapy with Aspirin 44m daily and Ticagrelor 935mtwice daily. Ischemia guided revascularization could be considered to OM2, as well as repeat attempt [place]  EKG 08/05/2020:  Sinus rhythm with first-degree AV block at a rate of 83 bpm.  Normal axis.  Inferior infarct, age indeterminate.   CT chest 10/03/2019:  Cardiovascular: Aortic atherosclerosis. Normal heart size, without pericardial effusion. Multivessel coronary artery atherosclerosis.   Recent labs: 08/05/2020: Glucose 144, BUN/Cr 19/1.00. EGFR 78. Na/K 139/4.8. Rest of the CMP normal H/H 15.5/45.0. MCV 91.5. Platelets 159 HbA1C 6.5% Chol 130, TG 117, HDL 48, LDL 61   Review of Systems  Constitutional: Negative for malaise/fatigue and weight gain.  Cardiovascular: Negative for chest pain, claudication, leg swelling, near-syncope, orthopnea, palpitations, paroxysmal nocturnal dyspnea and syncope.  Respiratory: Negative for shortness of breath.   Hematologic/Lymphatic: Does not bruise/bleed easily.  Gastrointestinal: Negative for melena.  Neurological:  Negative for dizziness and weakness.       Vitals:   08/12/20 0956  BP: 134/75  Pulse: 70  Resp: 16  SpO2: 98%     Body mass index is 25.94 kg/m. Filed Weights   08/12/20 0956  Weight: 186 lb (84.4 kg)    Objective:   Physical Exam Vitals reviewed.  HENT:     Head: Normocephalic and atraumatic.  Cardiovascular:     Rate and Rhythm: Normal rate and regular rhythm.     Pulses: Intact distal pulses.          Carotid pulses are 2+ on the right side and 2+ on the left side.      Radial pulses are 2+ on the right side and 2+ on the left side.       Femoral pulses are 2+ on the right side and 2+ on the left side.  Popliteal pulses are 2+ on the right side and 2+ on the left side.       Dorsalis pedis pulses are 2+ on the right side and 2+ on the left side.       Posterior tibial pulses are 2+ on the right side and 2+ on the left side.     Heart sounds: S1 normal and S2 normal. No murmur heard. No gallop.      Comments: Catheter insertion site well healed, without bruit or hematoma.  Pulmonary:     Effort: Pulmonary effort is normal. No respiratory distress.     Breath sounds: No wheezing, rhonchi or rales.  Musculoskeletal:     Right lower leg: No edema.     Left lower leg: No edema.  Neurological:     Mental Status: He is alert.        Assessment & Recommendations:   70 y.o. caucasian male with history of type 2 diabetes mellitus, hypertension, chronic kidney disease, tobacco use, hypercholesterolemia, and abdominal aortic atherosclerosis and coronary atherosclerosis noted on CT. Patient also has family history of early CAD.    Angina pectoris: Patient underwent coronary angiography revealing occluded RPDA and 80% focal stenosis of OM2. See further details above.  Attempted PCI to RPDA was unsuccessful.  Current guideline directed medical therapy: losartan, Toprol-XL, Crestor, and as needed sublingual nitroglycerin.  As patient has had no recurrence of chest  pain, recommend continued medical management and uninterrupted dual antiplatelet therapy with Aspirin 13m daily and Ticagrelor 933mtwice daily.  If patient has recurrence of symptoms with schedule for repeat cardiac catheterization.  Will schedule for nuclear stress testing in 4 weeks to evaluate for ischemia, particularly due to OM2 stenosis.  Patient's shortness of breath is likely due to side effect of Brilinta, advised him to take Brilinta with caffeine.  Patient will notify our office if symptoms do not improve.  Coronary artery atherosclerosis noted on CT: Recommend management of cardiovascular risk factors.  Continue aspirin   Hypercholesterolemia: Lipids are well controlled. Continue Crestor   Hypertension:  Well controlled.  Continue losartan   Tobacco use: Counseled patient regarding abstinence from tobacco use.  Offered patient nicotine replacement therapy, however patient states he does not feel he needs it at this time.  Patient will notify our office if he would like prescription for nicotine replacement therapy, gum or patches.    Follow-up in 6 weeks after stress test.   Patient was seen in collaboration with Dr. PaVirgina JockHe also reviewed patient's chart and examined the patient. Dr. PaVirgina Jocks in agreement of the plan.    CeAlethia BertholdPA-C 08/12/2020, 11:03 AM Office: [phone]

## 2020-08-12 ENCOUNTER — Other Ambulatory Visit: Payer: Self-pay

## 2020-08-12 ENCOUNTER — Ambulatory Visit: Payer: Medicare Other | Admitting: Student

## 2020-08-12 ENCOUNTER — Encounter: Payer: Self-pay | Admitting: Student

## 2020-08-12 VITALS — BP 134/75 | HR 70 | Resp 16 | Ht 71.0 in | Wt 186.0 lb

## 2020-08-12 DIAGNOSIS — E7800 Pure hypercholesterolemia, unspecified: Secondary | ICD-10-CM

## 2020-08-12 DIAGNOSIS — Z720 Tobacco use: Secondary | ICD-10-CM | POA: Diagnosis not present

## 2020-08-12 DIAGNOSIS — I208 Other forms of angina pectoris: Secondary | ICD-10-CM

## 2020-08-12 DIAGNOSIS — I10 Essential (primary) hypertension: Secondary | ICD-10-CM

## 2020-08-12 DIAGNOSIS — E119 Type 2 diabetes mellitus without complications: Secondary | ICD-10-CM

## 2020-08-12 DIAGNOSIS — I2510 Atherosclerotic heart disease of native coronary artery without angina pectoris: Secondary | ICD-10-CM

## 2020-08-12 MED ORDER — METOPROLOL SUCCINATE ER 25 MG PO TB24: 25.0000 mg | ORAL_TABLET | Freq: Every day | ORAL | 3 refills | Status: DC

## 2020-08-12 MED ORDER — TICAGRELOR 90 MG PO TABS: 90.0000 mg | ORAL_TABLET | Freq: Two times a day (BID) | ORAL | 3 refills | Status: DC

## 2020-08-14 ENCOUNTER — Other Ambulatory Visit: Payer: Self-pay

## 2020-08-14 ENCOUNTER — Telehealth: Payer: Self-pay

## 2020-08-14 MED ORDER — LOPERAMIDE HCL 2 MG PO TABS: 2.0000 mg | ORAL_TABLET | Freq: Three times a day (TID) | ORAL | 0 refills | Status: DC | PRN

## 2020-08-14 NOTE — Telephone Encounter (Signed)
Telephone encounter:  Reason for call: pt has had diarrhea for  A few days and he thinks it is one of the newer meds, metoprolol or brilinta medications. He also wanted to know if there is something he can take for the diarrhea  Usual provider: MP  Last office visit: 08/12/20 with Anderson Malta   Next office visit: 09/21/20   Last hospitalization: 08/06/20   Current Outpatient Medications on File Prior to Visit  Medication Sig Dispense Refill  . aspirin EC 81 MG tablet Take 1 tablet (81 mg total) by mouth daily.    . Calcium Citrate (CAL-CITRATE PO) Take 500 mg by mouth daily.    . famotidine (PEPCID) 40 MG tablet Take 40 mg by mouth daily.    Marland Kitchen losartan (COZAAR) 100 MG tablet Take 100 mg by mouth every morning.     . metoprolol succinate (TOPROL-XL) 25 MG 24 hr tablet Take 1 tablet (25 mg total) by mouth daily. 90 tablet 3  . Multiple Vitamins-Minerals (MULTIVITAMIN WITH MINERALS) tablet Take 1 tablet by mouth daily.    . nitroGLYCERIN (NITROSTAT) 0.4 MG SL tablet Place 1 tablet (0.4 mg total) under the tongue every 5 (five) minutes as needed for chest pain. 90 tablet 3  . rosuvastatin (CRESTOR) 10 MG tablet Take 10 mg by mouth daily.    . tamsulosin (FLOMAX) 0.4 MG CAPS Take 0.4 mg by mouth daily.    . ticagrelor (BRILINTA) 90 MG TABS tablet Take 1 tablet (90 mg total) by mouth 2 (two) times daily. 180 tablet 3   No current facility-administered medications on file prior to visit.

## 2020-09-14 ENCOUNTER — Ambulatory Visit: Payer: Medicare Other

## 2020-09-14 ENCOUNTER — Other Ambulatory Visit: Payer: Self-pay

## 2020-09-14 DIAGNOSIS — I208 Other forms of angina pectoris: Secondary | ICD-10-CM | POA: Diagnosis not present

## 2020-09-15 DIAGNOSIS — H5203 Hypermetropia, bilateral: Secondary | ICD-10-CM | POA: Diagnosis not present

## 2020-09-18 NOTE — Progress Notes (Signed)
Patient referred by [name], MD for chest pain.   Subjective:   Adrian Baird, male    DOB: 1951/03/24, 70 y.o.   MRN: [id number]  Chief Complaint  Patient presents with  . Coronary Artery Disease  . Results  . Follow-up    HPI  70 y.o. caucasian male with history of type 2 diabetes mellitus, hypertension, chronic kidney disease, tobacco use, hypercholesterolemia, and abdominal aortic atherosclerosis and coronary atherosclerosis noted on CT. Patient also has family history of early CAD.   Patient underwent urgent cardiac catheterization on 08/06/2020 revealing  occluded RPDA with faint collaterals filling the distal vessel and OM2 with 80% focal stenosis. PCI to RPDA was attempted but unsuccessful, recommended medical management.    Patient presents for 6 week follow up to discuss results of stress test which revealed small amount of reversible ischemia in RCA distribution. Patient is doing well overall and remains asymptomatic. He had no chest pain during stress test, patient walked on treadmill for over 8 minutes. Denies chest pain, dyspnea, palpitations, syncope, near-syncope. He has been cautiously avoiding exerting himself, but has played golf and slowly increased his physical activity without issue.    Past Medical History:  Diagnosis Date  . Decreased libido   . Diverticulosis   . ED (erectile dysfunction)   . Elevated PSA    recently biopsied, seeing Dr. Jasmine December at [facility]  . Hyperplastic colon polyp   . Hypogonadism male   . Kidney stones 2007  . Personal history of urinary calculi   . RUQ abdominal pain     Past Surgical History:  Procedure Laterality Date  . APPENDECTOMY    . CORONARY BALLOON ANGIOPLASTY N/A 08/06/2020   Procedure: CORONARY BALLOON ANGIOPLASTY;  Surgeon: Nigel Mormon, MD;  Location: [place] [place] [facility];  Service: Cardiovascular;  Laterality: N/A;  . CYSTOSCOPY W/ URETEROSCOPY     hx of  . INGUINAL HERNIA REPAIR     [place] SURGERY  2007   ?laser, possibly lithotripsy  . KIDNEY STONE SURGERY  2007   ?laser, possibly lithotripsy  . LEFT HEART CATH AND CORONARY ANGIOGRAPHY N/A 08/06/2020   Procedure: LEFT HEART CATH AND CORONARY ANGIOGRAPHY;  Surgeon: Nigel Mormon, MD;  Location: [place] [facility];  Service: Cardiovascular;  Laterality: N/A;  . TONSILLECTOMY  age 70    Social History   Tobacco Use  Smoking Status Former Smoker  . Packs/day: 0.25  . Types: Cigarettes  Smokeless Tobacco Never Used  Tobacco Comment   Quit after Heart Attack    Social History   Substance and Sexual Activity  Alcohol Use Yes  . Alcohol/week: 14.0 standard drinks  . Types: 7 Glasses of wine, 7 Cans of beer per week   Comment: 1-2 drinks per day    Family History  Problem Relation Age of Onset  . Stroke Mother 68  . Heart disease Mother 60  . Prostate cancer Father 69  . Heart disease Father 94  . Hyperlipidemia Father   . Stroke Sister   . Prostate cancer Cousin   . Lung cancer Maternal Uncle   . Breast cancer Maternal Aunt        mid-40's  . Prostate cancer Paternal Uncle   . Colon cancer Neg Hx     Current Outpatient Medications on File Prior to Visit  Medication Sig Dispense Refill  . aspirin EC 81 MG tablet Take 1 tablet (81 mg total) by mouth daily.    Marland Kitchen  Calcium Citrate (CAL-CITRATE PO) Take 500 mg by mouth daily.    . famotidine (PEPCID) 40 MG tablet Take 40 mg by mouth daily.    Marland Kitchen loperamide (IMODIUM A-D) 2 MG tablet Take 1 tablet (2 mg total) by mouth 3 (three) times daily as needed for diarrhea or loose stools. 10 tablet 0  . losartan (COZAAR) 100 MG tablet Take 100 mg by mouth every morning.     . metoprolol succinate (TOPROL-XL) 25 MG 24 hr tablet Take 1 tablet (25 mg total) by mouth daily. 90 tablet 3  . Multiple Vitamins-Minerals (MULTIVITAMIN WITH MINERALS) tablet Take 1 tablet by mouth daily.    . nitroGLYCERIN (NITROSTAT) 0.4 MG SL tablet Place 1 tablet (0.4 mg total)  under the tongue every 5 (five) minutes as needed for chest pain. 90 tablet 3  . rosuvastatin (CRESTOR) 10 MG tablet Take 10 mg by mouth daily.    . tadalafil (CIALIS) 10 MG tablet Take 10 mg by mouth as needed for erectile dysfunction.    . tamsulosin (FLOMAX) 0.4 MG CAPS Take 0.4 mg by mouth daily.    . ticagrelor (BRILINTA) 90 MG TABS tablet Take 1 tablet (90 mg total) by mouth 2 (two) times daily. 180 tablet 3   No current facility-administered medications on file prior to visit.    Cardiovascular and other pertinent studies:  PCV MYOCARDIAL PERFUSION WO LEXISCAN 09/14/2020 1 Day Rest/Stress Protocol. Exercise nuclear stress test was performed using Bruce protocol. Patient exercised for 8 minutes 15 seconds, achieved 10.06 METS, and 84% of age predicted maximum heart rate. Stress EKG negative for ischemia. Medium size, moderate intensity, reversible perfusion defect suggestive of ischemia in the RCA distribution. Intermediate risk study, clinical correlation is suggested. No prior study available for comparison.  EKG 08/12/2020:  Sinus rhythm with first-degree AV block at a rate of 61 bpm.  Normal axis.  Inferior infarct, age indeterminate. PRWP, cannot exclude anteroseptal infarct old.   Left heart catheterization coronary angiography 08/06/2020: LM: Normal LAD: Short vessel, does not reach apexMid 40%, distal 40% stenoses LCx: Prox 30% stenosis      Small t medium sized OM2 with diffuse disease, and focal 80% stenosis Ramus: Normnal RCA: Large vessel      Prox focal 50% stenosis      Mid 20% stenosis      Occulded RPDA with faint collaterals  filling the distal vessel  Attempted PCI to RPDA Able to wire the vessel but unable to advance further with minimal flow in spite prox PTCA  Fluoro time: 36 min Contrast used: 145 cc  Recommendations: Recommend uninterrupted dual antiplatelet therapy with Aspirin $RemoveBefo'81mg'DOkSVGHoTXs$  daily and Ticagrelor $RemoveBefore'90mg'rRcEUSXolTSma$  twice daily. Ischemia guided  revascularization could be considered to OM2, as well as repeat attempt at [facility] PCI  EKG 08/05/2020:  Sinus rhythm with first-degree AV block at a rate of 83 bpm.  Normal axis.  Inferior infarct, age indeterminate.   CT chest 10/03/2019:  Cardiovascular: Aortic atherosclerosis. Normal heart size, without pericardial effusion. Multivessel coronary artery atherosclerosis.   Recent labs: 08/05/2020: Glucose 144, BUN/Cr 19/1.00. EGFR 78. Na/K 139/4.8. Rest of the CMP normal H/H 15.5/45.0. MCV 91.5. Platelets 159 HbA1C 6.5% Chol 130, TG 117, HDL 48, LDL 61   Review of Systems  Constitutional: Negative for malaise/fatigue and weight gain.  Cardiovascular: Negative for chest pain, claudication, leg swelling, near-syncope, orthopnea, palpitations, paroxysmal nocturnal dyspnea and syncope.  Respiratory: Negative for shortness of breath.   Hematologic/Lymphatic: Does not bruise/bleed easily.  Gastrointestinal: Negative  for melena.  Neurological: Negative for dizziness and weakness.       Vitals:   09/21/20 0943  BP: 113/70  Pulse: 65  Temp: 98.1 F (36.7 C)  SpO2: 99%     Body mass index is 26.08 kg/m. Filed Weights   09/21/20 0943  Weight: 187 lb (84.8 kg)    Objective:   Physical Exam Vitals reviewed.  HENT:     Head: Normocephalic and atraumatic.  Cardiovascular:     Rate and Rhythm: Normal rate and regular rhythm.     Pulses: Intact distal pulses.     Heart sounds: S1 normal and S2 normal. No murmur heard. No gallop.   Pulmonary:     Effort: Pulmonary effort is normal. No respiratory distress.     Breath sounds: No wheezing, rhonchi or rales.  Musculoskeletal:     Right lower leg: No edema.     Left lower leg: No edema.  Skin:    General: Skin is warm and dry.  Neurological:     General: No focal deficit present.     Mental Status: He is alert.  Psychiatric:        Mood and Affect: Mood normal.        Behavior: Behavior normal.        Assessment &  Recommendations:   70 y.o. caucasian male with history of type 2 diabetes mellitus, hypertension, chronic kidney disease, tobacco use, hypercholesterolemia, and abdominal aortic atherosclerosis and coronary atherosclerosis noted on CT. Patient also has family history of early CAD. He underwent left heart catheterization 08/06/20 with attempted PCI to RPDA, which was unsuccessful and medical management was recommended.    Coronary artery disease:  Patient underwent coronary angiography revealing occluded RPDA and 80% focal stenosis of OM2. See further details above.  Attempted PCI to RPDA was unsuccessful.  Current guideline directed medical therapy: losartan, Toprol-XL, Crestor, and as needed sublingual nitroglycerin. Stress test revealed  Perfusion defect in the RCA distribution with only small amount of reversibility.  As patient remains asymptomatic, especially as he reported no anginal symptoms during stress test, recommend continuing medical management at this time. Advised patient to slowly increase physical activity. Will refer to cardiac rehab.  Will continue to follow closely clinically and have low threshold for repeat cardiac catheterization if patient has any recurrence of anginal symptoms.  Counseled patient regarding use of nitroglycerin, as well as interaction with Cialis. Patient verbalized understanding. Continue losartan, metoprolol succinate, rosuvastatin.  Continue dual antiplatelet therapy with aspirin and Brilinta.   Coronary artery atherosclerosis noted on CT: Recommend management of cardiovascular risk factors.  Continue aspirin   Hypercholesterolemia: Lipids are well controlled. Continue Crestor   Hypertension:  Well controlled.  Continue losartan   Tobacco use: Counseled patient regarding abstinence from tobacco use.    Follow-up in 6 weeks.   Patient was seen in collaboration with Dr. Virgina Jock. He also reviewed patient's chart and examined the patient.  Dr. Virgina Jock is in agreement of the plan.    Alethia Berthold, PA-C 09/21/2020, 3:44 PM Office: [phone]

## 2020-09-21 ENCOUNTER — Ambulatory Visit: Payer: Medicare Other | Admitting: Student

## 2020-09-21 ENCOUNTER — Other Ambulatory Visit: Payer: Self-pay

## 2020-09-21 ENCOUNTER — Encounter: Payer: Self-pay | Admitting: Student

## 2020-09-21 VITALS — BP 113/70 | HR 65 | Temp 98.1°F | Ht 71.0 in | Wt 187.0 lb

## 2020-09-21 DIAGNOSIS — I10 Essential (primary) hypertension: Secondary | ICD-10-CM

## 2020-09-21 DIAGNOSIS — Z720 Tobacco use: Secondary | ICD-10-CM | POA: Diagnosis not present

## 2020-09-21 DIAGNOSIS — I2510 Atherosclerotic heart disease of native coronary artery without angina pectoris: Secondary | ICD-10-CM

## 2020-09-24 DIAGNOSIS — E785 Hyperlipidemia, unspecified: Secondary | ICD-10-CM | POA: Diagnosis not present

## 2020-09-24 DIAGNOSIS — Z125 Encounter for screening for malignant neoplasm of prostate: Secondary | ICD-10-CM | POA: Diagnosis not present

## 2020-09-24 DIAGNOSIS — I129 Hypertensive chronic kidney disease with stage 1 through stage 4 chronic kidney disease, or unspecified chronic kidney disease: Secondary | ICD-10-CM | POA: Diagnosis not present

## 2020-09-28 ENCOUNTER — Telehealth (HOSPITAL_COMMUNITY): Payer: Self-pay

## 2020-09-28 DIAGNOSIS — U071 COVID-19: Secondary | ICD-10-CM | POA: Diagnosis not present

## 2020-09-28 DIAGNOSIS — Z20822 Contact with and (suspected) exposure to covid-19: Secondary | ICD-10-CM | POA: Diagnosis not present

## 2020-09-28 NOTE — Telephone Encounter (Signed)
Pt insurance is active and benefits verified through [facility]. Co-pay $0.00, DED $0.00/$0.00 met, out of pocket $4,200.00/$250.36 met, co-insurance 0%. No pre-authorization required. Passport, 09/28/20 @ 12:13PM, RFF#[id number]  Will contact patient to see if he is interested in the Cardiac Rehab Program. Patient will be contacted for scheduling upon review by the RN Navigator.

## 2020-09-29 ENCOUNTER — Encounter (HOSPITAL_COMMUNITY): Payer: Self-pay | Admitting: *Deleted

## 2020-09-29 NOTE — Progress Notes (Signed)
Received referral from Dr. Virgina Jock for this pt to participate in cardiac rehab with the diagnosis of Stable Angina.  Pt with recent hospitalization with unsuccessful attempt to Right PDA plan to treat medically.  Pt seen by inpatient cardiac rehab staff who deferred outpatient cardiac rehab phase II to the discretion of the provider. Clinical review of pt follow up appt on 2/14 with Lawerance Cruel NP with Dr. Virgina Jock - cardiologist office note. Pt is making the expected progress in recovery.  Pt appropriate for scheduling for on site cardiac rehab and/or enrollment in Virtual Cardiac Rehab.  Pt Covid Risk Score is 5.  Will forward to staff for follow up. Cherre Huger, BSN Cardiac and Training and development officer

## 2020-11-01 NOTE — Progress Notes (Signed)
Patient referred by Adrian Pretty, MD for chest pain.   Subjective:   Adrian Baird, male    DOB: Jan 21, 1951, 70 y.o.   MRN: [id number]  Chief Complaint  Patient presents with  . Coronary Artery Disease  . Follow-up    6 weeks    HPI  70 y.o. caucasian male with hypertension, type 2 DM, CAD  Patient presented 5 days after an episode of chest pain that was suggestive of MI.  However, given his ongoing chest pain, attempted to mention to occluded RPDA, but was unsuccessful.  He has residual 80% stenosis in a small OM branch.  However, he is doing well without any angina symptoms at this time.  He walked hills [place] his golf [place] but did not have any chest pain or shortness of breath.  Stress test showed RCA territory infarct with only minimal peri-infarct ischemia.  He is compliant with his medical therapy.  Blood pressure slightly elevated today.  He reports 2 episodes of shortness of breath at night lasting for 1 minute, but was self resolving.  He has not had any recurrence of   Current Outpatient Medications on File Prior to Visit  Medication Sig Dispense Refill  . aspirin EC 81 MG tablet Take 1 tablet (81 mg total) by mouth daily.    . Calcium Citrate (CAL-CITRATE PO) Take 500 mg by mouth daily.    . famotidine (PEPCID) 40 MG tablet Take 40 mg by mouth daily.    Marland Kitchen losartan (COZAAR) 100 MG tablet Take 100 mg by mouth every morning.     . metoprolol succinate (TOPROL-XL) 25 MG 24 hr tablet Take 1 tablet (25 mg total) by mouth daily. 90 tablet 3  . Multiple Vitamins-Minerals (MULTIVITAMIN WITH MINERALS) tablet Take 1 tablet by mouth daily.    . nitroGLYCERIN (NITROSTAT) 0.4 MG SL tablet Place 1 tablet (0.4 mg total) under the tongue every 5 (five) minutes as needed for chest pain. 90 tablet 3  . rosuvastatin (CRESTOR) 10 MG tablet Take 10 mg by mouth daily.    . tadalafil (CIALIS) 10 MG tablet Take 10 mg by mouth as needed for erectile dysfunction.    . tamsulosin (FLOMAX) 0.4 MG  CAPS Take 0.4 mg by mouth daily.    . ticagrelor (BRILINTA) 90 MG TABS tablet Take 1 tablet (90 mg total) by mouth 2 (two) times daily. 180 tablet 3   No current facility-administered medications on file prior to visit.    Cardiovascular and other pertinent studies:  PCV MYOCARDIAL PERFUSION WO LEXISCAN 09/14/2020 1 Day Rest/Stress Protocol. Exercise nuclear stress test was performed using Bruce protocol. Patient exercised for 8 minutes 15 seconds, achieved 10.06 METS, and 84% of age predicted maximum heart rate. Stress EKG negative for ischemia. Medium size, moderate intensity, reversible perfusion defect suggestive of ischemia in the RCA distribution. Intermediate risk study, clinical correlation is suggested. No prior study available for comparison.  EKG 08/12/2020:  Sinus rhythm with first-degree AV block at a rate of 61 bpm.  Normal axis.  Inferior infarct, age indeterminate. PRWP, cannot exclude anteroseptal infarct old.   Left heart catheterization coronary angiography 08/06/2020: LM: Normal LAD: Short vessel, does not reach apexMid 40%, distal 40% stenoses LCx: Prox 30% stenosis      Small to medium sized OM2 with diffuse disease, and focal 80% stenosis Ramus: Normnal RCA: Large vessel      Prox focal 50% stenosis      Mid 20% stenosis  Occulded RPDA with faint collaterals  filling the distal vessel  Attempted PCI to RPDA Able to wire the vessel but unable to advance further with minimal flow in spite prox PTCA  Fluoro time: 36 min Contrast used: 145 cc  Recommendations: Recommend uninterrupted dual antiplatelet therapy with Aspirin 35m daily and Ticagrelor 924mtwice daily. Ischemia guided revascularization could be considered to OM2, as well as repeat attempt at [facility]  EKG 08/05/2020:  Sinus rhythm with first-degree AV block at a rate of 83 bpm.  Normal axis.  Inferior infarct, age indeterminate.   CT chest 10/03/2019:  Cardiovascular: Aortic  atherosclerosis. Normal heart size, without pericardial effusion. Multivessel coronary artery atherosclerosis.   Recent labs: 08/05/2020: Glucose 144, BUN/Cr 19/1.00. EGFR 78. Na/K 139/4.8. Rest of the CMP normal H/H 15.5/45.0. MCV 91.5. Platelets 159 HbA1C 6.5% Chol 130, TG 117, HDL 48, LDL 61   Review of Systems  Constitutional: Negative for malaise/fatigue and weight gain.  Cardiovascular: Negative for chest pain, claudication, leg swelling, near-syncope, orthopnea, palpitations, paroxysmal nocturnal dyspnea and syncope.  Respiratory: Negative for shortness of breath.   Hematologic/Lymphatic: Does not bruise/bleed easily.  Gastrointestinal: Negative for melena.  Neurological: Negative for dizziness and weakness.       Vitals:   11/02/20 1146  BP: (!) 145/77  Pulse: (!) 54  Resp: 16  Temp: 97.8 F (36.6 C)  SpO2: 99%     Body mass index is 26.22 kg/m. Filed Weights   11/02/20 1146  Weight: 188 lb (85.3 kg)    Objective:   Physical Exam Vitals reviewed.  HENT:     Head: Normocephalic and atraumatic.  Cardiovascular:     Rate and Rhythm: Normal rate and regular rhythm.     Pulses: Intact distal pulses.     Heart sounds: S1 normal and S2 normal. No murmur heard. No gallop.   Pulmonary:     Effort: Pulmonary effort is normal. No respiratory distress.     Breath sounds: No wheezing, rhonchi or rales.  Musculoskeletal:     Right lower leg: No edema.     Left lower leg: No edema.  Skin:    General: Skin is warm and dry.  Neurological:     General: No focal deficit present.     Mental Status: He is alert.  Psychiatric:        Mood and Affect: Mood normal.        Behavior: Behavior normal.        Assessment & Recommendations:   [age].o. caucasian male with hypertension, type 2 DM, CAD  Coronary artery disease:  No angina symptoms with minimal peri-infarct ischemia on stress test. Previous unsuccessful attempt of RPDA revascularization. Continue  aggressive medical management including aspirin, statin, antianginal therapy. Increased metoprolol succinate to 50 mg daily. Lipids very well controlled. He is only smoked 2 cigarettes since his MI event in January.  Congratulated him and reemphasized continued cessation.  Hypertension:  Increase metoprolol succinate to 50 mg daily.  F/u in 3 months    MaGeneral MillsPA-C 11/01/2020, 11:30 PM Office: [phone]

## 2020-11-02 ENCOUNTER — Encounter: Payer: Self-pay | Admitting: Cardiology

## 2020-11-02 ENCOUNTER — Other Ambulatory Visit: Payer: Self-pay

## 2020-11-02 ENCOUNTER — Ambulatory Visit: Payer: Medicare Other | Admitting: Cardiology

## 2020-11-02 VITALS — BP 145/77 | HR 54 | Temp 97.8°F | Resp 16 | Ht 71.0 in | Wt 188.0 lb

## 2020-11-02 DIAGNOSIS — I10 Essential (primary) hypertension: Secondary | ICD-10-CM | POA: Insufficient documentation

## 2020-11-02 DIAGNOSIS — I2510 Atherosclerotic heart disease of native coronary artery without angina pectoris: Secondary | ICD-10-CM

## 2020-11-02 MED ORDER — METOPROLOL SUCCINATE ER 50 MG PO TB24: 50.0000 mg | ORAL_TABLET | Freq: Every day | ORAL | 3 refills | Status: DC

## 2020-11-05 ENCOUNTER — Telehealth (HOSPITAL_COMMUNITY): Payer: Self-pay

## 2020-11-06 ENCOUNTER — Telehealth (HOSPITAL_COMMUNITY): Payer: Self-pay | Admitting: Pharmacist

## 2020-11-06 NOTE — Telephone Encounter (Signed)
Cardiac Rehab Medication Review by a Pharmacist  Does the patient  feel that his/her medications are working for him/her?  yes  Has the patient been experiencing any side effects to the medications prescribed?  no  Does the patient measure his/her own blood pressure or blood glucose at home?  no   Does the patient have any problems obtaining medications due to transportation or finances?   no  Understanding of regimen: good Understanding of indications: good Potential of compliance: good    Pharmacist Intervention: Patient very understanding of his medication regimen. The patient did indicate that his provider recently increased his metoprolol succinate from 25 mg to 50 mg. No side effects noted.     Cephus Slater, PharmD, [facility] Resident [phone] 11/06/2020 4:34 PM

## 2020-11-10 ENCOUNTER — Encounter (HOSPITAL_COMMUNITY)
Admission: RE | Admit: 2020-11-10 | Discharge: 2020-11-10 | Disposition: A | Discharge status: Home / Self Care | Payer: Medicare Other | Source: Ambulatory Visit | Attending: Cardiology | Admitting: Cardiology

## 2020-11-10 ENCOUNTER — Other Ambulatory Visit: Payer: Self-pay

## 2020-11-10 ENCOUNTER — Encounter (HOSPITAL_COMMUNITY): Payer: Self-pay

## 2020-11-10 VITALS — BP 108/70 | HR 72 | Ht 70.25 in | Wt 185.6 lb

## 2020-11-10 DIAGNOSIS — I208 Other forms of angina pectoris: Secondary | ICD-10-CM

## 2020-11-10 DIAGNOSIS — Z79899 Other long term (current) drug therapy: Secondary | ICD-10-CM | POA: Insufficient documentation

## 2020-11-10 HISTORY — DX: Type 2 diabetes mellitus without complications: E11.9

## 2020-11-10 HISTORY — DX: Atherosclerotic heart disease of native coronary artery without angina pectoris: I25.10

## 2020-11-10 NOTE — Progress Notes (Signed)
Cardiac Individual Treatment Plan  Patient Details  Name: Adrian Baird MRN: [id number] Date of Birth: June 21, 1951 Referring Provider:   Flowsheet Row CARDIAC REHAB PHASE II ORIENTATION from 11/10/2020 in [place]  Referring Provider Vernell Leep, MD      Initial Encounter Date:  [place] PHASE II ORIENTATION from 11/10/2020 in [place]  Date 11/10/20      Visit Diagnosis: Stable angina ([place]) S/P Cath 08/06/20 Medical Treatment  Patient's Home Medications on Admission:  Current Outpatient Medications:  .  aspirin EC 81 MG tablet, Take 1 tablet (81 mg total) by mouth daily., Disp: , Rfl:  .  Calcium Citrate (CAL-CITRATE PO), Take 500 mg by mouth daily., Disp: , Rfl:  .  famotidine (PEPCID) 40 MG tablet, Take 40 mg by mouth daily., Disp: , Rfl:  .  losartan (COZAAR) 100 MG tablet, Take 100 mg by mouth every morning. , Disp: , Rfl:  .  metoprolol succinate (TOPROL-XL) 50 MG 24 hr tablet, Take 1 tablet (50 mg total) by mouth daily., Disp: 90 tablet, Rfl: 3 .  Multiple Vitamins-Minerals (MULTIVITAMIN WITH MINERALS) tablet, Take 1 tablet by mouth daily., Disp: , Rfl:  .  nitroGLYCERIN (NITROSTAT) 0.4 MG SL tablet, Place 1 tablet (0.4 mg total) under the tongue every 5 (five) minutes as needed for chest pain., Disp: 90 tablet, Rfl: 3 .  rosuvastatin (CRESTOR) 10 MG tablet, Take 10 mg by mouth daily., Disp: , Rfl:  .  tadalafil (CIALIS) 10 MG tablet, Take 10 mg by mouth as needed for erectile dysfunction., Disp: , Rfl:  .  tamsulosin (FLOMAX) 0.4 MG CAPS, Take 0.4 mg by mouth daily., Disp: , Rfl:  .  ticagrelor (BRILINTA) 90 MG TABS tablet, Take 1 tablet (90 mg total) by mouth 2 (two) times daily., Disp: 180 tablet, Rfl: 3  Past Medical History: Past Medical History:  Diagnosis Date  . Coronary artery disease   . Decreased libido   . Diabetes mellitus without complication ([place])   . Diverticulosis    . ED (erectile dysfunction)   . Elevated PSA    recently biopsied, seeing Dr. Jasmine December at [facility]  . Hyperplastic colon polyp   . Hypogonadism male   . Kidney stones 2007  . Personal history of urinary calculi   . RUQ abdominal pain     Tobacco Use: Social History   Tobacco Use  Smoking Status Former Smoker  . Packs/day: 0.25  . Years: 20.00  . Pack years: 5.00  . Types: Cigarettes  . Quit date: 2022  . Years since quitting: 0.2  Smokeless Tobacco Never Used  Tobacco Comment   Quit after Heart Attack    Labs: Recent Review Flowsheet Data    Labs for ITP Cardiac and Pulmonary Rehab Latest Ref Rng & Units 02/27/2013   Cholestrol 0 - 200 mg/dL 180   LDLCALC 0 - 99 mg/dL 101(H)   HDL >39 mg/dL 63   Trlycerides <150 mg/dL 78      Capillary Blood Glucose: No results found for: GLUCAP   Exercise Target Goals: Exercise Program Goal: Individual exercise prescription set using results from initial 6 min walk test and THRR while considering  patient's activity barriers and safety.   Exercise Prescription Goal: Starting with aerobic activity 30 plus minutes a day, 3 days per week for initial exercise prescription. Provide home exercise prescription and guidelines that participant acknowledges understanding prior to discharge.  Activity Barriers & Risk  Stratification:  Activity Barriers & Cardiac Risk Stratification - 11/10/20 1549      Activity Barriers & Cardiac Risk Stratification   Activity Barriers None    Cardiac Risk Stratification Moderate           6 Minute Walk:  6 Minute Walk    Row Name 11/10/20 1508         6 Minute Walk   Phase Initial     Distance 1436 feet     Walk Time 6 minutes     # of Rest Breaks 0     MPH 2.72     METS 31.3     RPE 8     Perceived Dyspnea  0     VO2 Peak 10.96     Symptoms No     Resting HR 65 bpm     Resting BP 108/70     Resting Oxygen Saturation  99 %     Exercise Oxygen Saturation  during 6 min walk 99 %      Max Ex. HR 74 bpm     Max Ex. BP 134/62     2 Minute Post BP 118/64            Oxygen Initial Assessment:   Oxygen Re-Evaluation:   Oxygen Discharge (Final Oxygen Re-Evaluation):   Initial Exercise Prescription:  Initial Exercise Prescription - 11/10/20 1500      Date of Initial Exercise RX and Referring Provider   Date 11/10/20    Referring Provider Vernell Leep, MD    Expected Discharge Date 01/08/21      Treadmill   MPH 2.8    Grade 1    Minutes 15    METs 3.53      NuStep   Level 3    SPM 75    Minutes 15    METs 2.4      Prescription Details   Frequency (times per week) 3    Duration Progress to 30 minutes of continuous aerobic without signs/symptoms of physical distress      Intensity   THRR 40-80% of Max Heartrate 61-122    Ratings of Perceived Exertion 11-13    Perceived Dyspnea 0-4      Progression   Progression Continue progressive overload as per policy without signs/symptoms or physical distress.      Resistance Training   Training Prescription Yes    Weight 5 lbs    Reps 10-15           Perform Capillary Blood Glucose checks as needed.  Exercise Prescription Changes:   Exercise Comments:   Exercise Goals and Review:  Exercise Goals    Row Name 11/10/20 1553             Exercise Goals   Increase Physical Activity Yes       Intervention Provide advice, education, support and counseling about physical activity/exercise needs.;Develop an individualized exercise prescription for aerobic and resistive training based on initial evaluation findings, risk stratification, comorbidities and participant's personal goals.       Expected Outcomes Short Term: Attend rehab on a regular basis to increase amount of physical activity.;Long Term: Add in home exercise to make exercise part of routine and to increase amount of physical activity.;Long Term: Exercising regularly at least 3-5 days a week.       Increase Strength and Stamina Yes        Intervention Provide advice, education, support and counseling about physical activity/exercise needs.;Develop an  individualized exercise prescription for aerobic and resistive training based on initial evaluation findings, risk stratification, comorbidities and participant's personal goals.       Expected Outcomes Short Term: Increase workloads from initial exercise prescription for resistance, speed, and METs.;Long Term: Improve cardiorespiratory fitness, muscular endurance and strength as measured by increased METs and functional capacity (6MWT);Short Term: Perform resistance training exercises routinely during rehab and add in resistance training at home       Able to understand and use rate of perceived exertion (RPE) scale Yes       Intervention Provide education and explanation on how to use RPE scale       Expected Outcomes Long Term:  Able to use RPE to guide intensity level when exercising independently;Short Term: Able to use RPE daily in rehab to express subjective intensity level       Knowledge and understanding of Target Heart Rate Range (THRR) Yes       Intervention Provide education and explanation of THRR including how the numbers were predicted and where they are located for reference       Expected Outcomes Short Term: Able to state/look up THRR;Long Term: Able to use THRR to govern intensity when exercising independently;Short Term: Able to use daily as guideline for intensity in rehab       Understanding of Exercise Prescription Yes       Intervention Provide education, explanation, and written materials on patient's individual exercise prescription       Expected Outcomes Short Term: Able to explain program exercise prescription;Long Term: Able to explain home exercise prescription to exercise independently              Exercise Goals Re-Evaluation :    Discharge Exercise Prescription (Final Exercise Prescription Changes):   Nutrition:  Target Goals: Understanding  of nutrition guidelines, daily intake of sodium 1500mg , cholesterol 200mg , calories 30% from fat and 7% or less from saturated fats, daily to have 5 or more servings of fruits and vegetables.  Biometrics:  Pre Biometrics - 11/10/20 1400      Pre Biometrics   Waist Circumference 41 inches    Hip Circumference 41.5 inches    Waist to Hip Ratio 0.99 %    Triceps Skinfold 16 mm    % Body Fat 27.6 %    Grip Strength 48 kg    Flexibility 9.25 in    Single Leg Stand 30 seconds            Nutrition Therapy Plan and Nutrition Goals:   Nutrition Assessments:  MEDIFICTS Score Key:  ?70 Need to make dietary changes   40-70 Heart Healthy Diet  ? 40 Therapeutic Level Cholesterol Diet   Picture Your Plate Scores:  <69 Unhealthy dietary pattern with much room for improvement.  41-50 Dietary pattern unlikely to meet recommendations for good health and room for improvement.  51-60 More healthful dietary pattern, with some room for improvement.   >60 Healthy dietary pattern, although there may be some specific behaviors that could be improved.    Nutrition Goals Re-Evaluation:   Nutrition Goals Discharge (Final Nutrition Goals Re-Evaluation):   Psychosocial: Target Goals: Acknowledge presence or absence of significant depression and/or stress, maximize coping skills, provide positive support system. Participant is able to verbalize types and ability to use techniques and skills needed for reducing stress and depression.  Initial Review & Psychosocial Screening:  Initial Psych Review & Screening - 11/10/20 1604      Initial Review  Current issues with None Identified      Family Dynamics   Good Support System? Yes   Lochlin has his wfie and children for support     Barriers   Psychosocial barriers to participate in program There are no identifiable barriers or psychosocial needs.      Screening Interventions   Interventions Encouraged to exercise            Quality of Life Scores:  Quality of Life - 11/10/20 1544      Quality of Life   Select Quality of Life      Quality of Life Scores   Health/Function Pre 28.83 %    Socioeconomic Pre 27.5 %    Psych/Spiritual Pre 28.21 %    Family Pre 27.6 %    GLOBAL Pre 28.25 %          Scores of 19 and below usually indicate a poorer quality of life in these areas.  A difference of  2-3 points is a clinically meaningful difference.  A difference of 2-3 points in the total score of the Quality of Life Index has been associated with significant improvement in overall quality of life, self-image, physical symptoms, and general health in studies assessing change in quality of life.  PHQ-9: Recent Review Flowsheet Data    Depression screen [facility] 2/9 11/10/2020 07/16/2018 02/27/2013   Decreased Interest 0 0 1   Down, Depressed, Hopeless 0 0 1   PHQ - 2 Score 0 0 2     Interpretation of Total Score  Total Score Depression Severity:  1-4 = Minimal depression, 5-9 = Mild depression, 10-14 = Moderate depression, 15-19 = Moderately severe depression, 20-27 = Severe depression   Psychosocial Evaluation and Intervention:   Psychosocial Re-Evaluation:   Psychosocial Discharge (Final Psychosocial Re-Evaluation):   Vocational Rehabilitation: Provide vocational rehab assistance to qualifying candidates.   Vocational Rehab Evaluation & Intervention:  Vocational Rehab - 11/10/20 1605      Initial Vocational Rehab Evaluation & Intervention   Assessment shows need for Vocational Rehabilitation No   Dezman is retired and does not need vocatinal rehab at this time          Education: Education Goals: Education classes will be provided on a weekly basis, covering required topics. Participant will state understanding/return demonstration of topics presented.  Learning Barriers/Preferences:  Learning Barriers/Preferences - 11/10/20 1545      Learning Barriers/Preferences   Learning Barriers  Sight;Hearing   Wears glasses, voices might need hearing aid   Learning Preferences Written Material;Skilled Demonstration;Individual Instruction;Group Instruction           Education Topics: Hypertension, Hypertension Reduction -Define heart disease and high blood pressure. Discus how high blood pressure affects the body and ways to reduce high blood pressure.   Exercise and Your Heart -Discuss why it is important to exercise, the FITT principles of exercise, normal and abnormal responses to exercise, and how to exercise safely.   Angina -Discuss definition of angina, causes of angina, treatment of angina, and how to decrease risk of having angina.   Cardiac Medications -Review what the following cardiac medications are used for, how they affect the body, and side effects that may occur when taking the medications.  Medications include Aspirin, Beta blockers, calcium channel blockers, ACE Inhibitors, angiotensin receptor blockers, diuretics, digoxin, and antihyperlipidemics.   Congestive Heart Failure -Discuss the definition of CHF, how to live with CHF, the signs and symptoms of CHF, and how keep track of weight  and sodium intake.   Heart Disease and Intimacy -Discus the effect sexual activity has on the heart, how changes occur during intimacy as we age, and safety during sexual activity.   Smoking Cessation / COPD -Discuss different methods to quit smoking, the health benefits of quitting smoking, and the definition of COPD.   Nutrition I: Fats -Discuss the types of cholesterol, what cholesterol does to the heart, and how cholesterol levels can be controlled.   Nutrition II: Labels -Discuss the different components of food labels and how to read food label   Heart Parts/Heart Disease and PAD -Discuss the anatomy of the heart, the pathway of blood circulation through the heart, and these are affected by heart disease.   Stress I: Signs and Symptoms -Discuss the  causes of stress, how stress may lead to anxiety and depression, and ways to limit stress.   Stress II: Relaxation -Discuss different types of relaxation techniques to limit stress.   Warning Signs of Stroke / TIA -Discuss definition of a stroke, what the signs and symptoms are of a stroke, and how to identify when someone is having stroke.   Knowledge Questionnaire Score:  Knowledge Questionnaire Score - 11/10/20 1546      Knowledge Questionnaire Score   Pre Score 25/28           Core Components/Risk Factors/Patient Goals at Admission:  Personal Goals and Risk Factors at Admission - 11/10/20 1546      Core Components/Risk Factors/Patient Goals on Admission    Weight Management Weight Maintenance    Tobacco Cessation Yes    Number of packs per day 1-2 cigarettes per week    Intervention Assist the participant in steps to quit. Provide individualized education and counseling about committing to Tobacco Cessation, relapse prevention, and pharmacological support that can be provided by physician.;Advice worker, assist with locating and accessing local/national Quit Smoking programs, and support quit date choice.   Patient given phone number to 1-800-quit-now   Expected Outcomes Short Term: Will demonstrate readiness to quit, by selecting a quit date.;Short Term: Will quit all tobacco product use, adhering to prevention of relapse plan.;Long Term: Complete abstinence from all tobacco products for at least 12 months from quit date.    Diabetes Yes    Intervention Provide education about signs/symptoms and action to take for hypo/hyperglycemia.;Provide education about proper nutrition, including hydration, and aerobic/resistive exercise prescription along with prescribed medications to achieve blood glucose in normal ranges: Fasting glucose 65-99 mg/dL    Expected Outcomes Short Term: Participant verbalizes understanding of the signs/symptoms and immediate care of  hyper/hypoglycemia, proper foot care and importance of medication, aerobic/resistive exercise and nutrition plan for blood glucose control.;Long Term: Attainment of HbA1C < 7%.    Hypertension Yes    Intervention Provide education on lifestyle modifcations including regular physical activity/exercise, weight management, moderate sodium restriction and increased consumption of fresh fruit, vegetables, and low fat dairy, alcohol moderation, and smoking cessation.;Monitor prescription use compliance.    Expected Outcomes Short Term: Continued assessment and intervention until BP is < 140/67mm HG in hypertensive participants. < 130/26mm HG in hypertensive participants with diabetes, heart failure or chronic kidney disease.;Long Term: Maintenance of blood pressure at goal levels.    Lipids Yes    Intervention Provide education and support for participant on nutrition & aerobic/resistive exercise along with prescribed medications to achieve LDL 70mg , HDL >40mg .    Expected Outcomes Short Term: Participant states understanding of desired cholesterol values and is compliant with medications  prescribed. Participant is following exercise prescription and nutrition guidelines.;Long Term: Cholesterol controlled with medications as prescribed, with individualized exercise RX and with personalized nutrition plan. Value goals: LDL < 70mg , HDL > 40 mg.           Core Components/Risk Factors/Patient Goals Review:    Core Components/Risk Factors/Patient Goals at Discharge (Final Review):    ITP Comments:  ITP Comments    Row Name 11/10/20 1600           ITP Comments Dr Fransico Him MD, Medical Director              Comments:Umair attended orientation on 11/10/2020 to review rules and guidelines for program.  Completed 6 minute walk test, Intitial ITP, and exercise prescription.  VSS. Telemetry-Sinus Rhythm, first degree heart block.  Asymptomatic. Safety measures and social distancing in place per CDC  guidelines. Patient given the phone number to 1-800-quit-now as Jago still smokes 1-2 cigarettes a week.Barnet Pall, RN,BSN 11/10/2020 4:15 PM

## 2020-11-16 ENCOUNTER — Other Ambulatory Visit: Payer: Self-pay

## 2020-11-16 ENCOUNTER — Encounter (HOSPITAL_COMMUNITY)
Admission: RE | Admit: 2020-11-16 | Discharge: 2020-11-16 | Disposition: A | Discharge status: Home / Self Care | Payer: Medicare Other | Source: Ambulatory Visit | Attending: Cardiology | Admitting: Cardiology

## 2020-11-16 DIAGNOSIS — I208 Other forms of angina pectoris: Secondary | ICD-10-CM | POA: Diagnosis not present

## 2020-11-16 DIAGNOSIS — Z79899 Other long term (current) drug therapy: Secondary | ICD-10-CM | POA: Diagnosis not present

## 2020-11-16 LAB — GLUCOSE, CAPILLARY
Glucose-Capillary: 160 mg/dL — ABNORMAL HIGH (ref 70–99)
Glucose-Capillary: 86 mg/dL (ref 70–99)

## 2020-11-16 NOTE — Progress Notes (Signed)
Daily Session Note  Patient Details  Name: Adrian Baird MRN: [id number] Date of Birth: 12-Mar-1951 Referring Provider:   Flowsheet Row CARDIAC REHAB PHASE II ORIENTATION from 11/10/2020 in [place]  Referring Provider Vernell Leep, MD      Encounter Date: 11/16/2020  Check In:  Session Check In - 11/16/20 1318      Check-In   Supervising physician immediately available to respond to emergencies [facility] immediately available    Physician(s) Dr Tawanna Solo    Location [facility]    Staff Present Barnet Pall, RN, Milus Glazier, MS, EP-C, CCRP;Jessica Hassell Done, MS, ACSM-CEP, Exercise Physiologist;Olinty Celesta Aver, MS, ACSM CEP, Exercise Physiologist;Other   Esmeralda Links, EP   Virtual Visit No    Medication changes reported     No    Fall or balance concerns reported    No    Tobacco Cessation No Change   Patient says he smokes  1-2 cigarettes a week   Current number of cigarettes/nicotine per day     0    Warm-up and Cool-down Performed on first and last piece of equipment    Resistance Training Performed Yes    VAD Patient? No    PAD/SET Patient? No      Pain Assessment   Currently in Pain? No/denies    Pain Score 0-No pain    Multiple Pain Sites No           Capillary Blood Glucose: Results for orders placed or performed during the hospital encounter of 11/16/20 (from the past 24 hour(s))  Glucose, capillary     Status: Abnormal   Collection Time: 11/16/20  1:09 PM  Result Value Ref Range   Glucose-Capillary 160 (H) 70 - 99 mg/dL  Glucose, capillary     Status: None   Collection Time: 11/16/20  2:03 PM  Result Value Ref Range   Glucose-Capillary 86 70 - 99 mg/dL     Exercise Prescription Changes - 11/16/20 1430      Response to Exercise   Blood Pressure (Admit) 106/56    Blood Pressure (Exercise) 134/66    Blood Pressure (Exit) 120/60    Heart Rate (Admit) 75 bpm    Heart Rate (Exercise) 82 bpm     Heart Rate (Exit) 75 bpm    Rating of Perceived Exertion (Exercise) 11    Symptoms None    Comments Pt's first day of exercise in the CRP2 program.    Duration Progress to 30 minutes of  aerobic without signs/symptoms of physical distress    Intensity THRR unchanged      Progression   Progression Continue to progress workloads to maintain intensity without signs/symptoms of physical distress.    Average METs 2.9      Resistance Training   Training Prescription Yes    Weight 5 lbs    Reps 10-15    Time 10 Minutes      Interval Training   Interval Training No      Treadmill   MPH 2.6    Grade 1    Minutes 15    METs 3.35      NuStep   Level 3    SPM 75    Minutes 15    METs 2.4           Social History   Tobacco Use  Smoking Status Former Smoker  . Packs/day: 0.25  . Years: 20.00  . Pack years: 5.00  .  Types: Cigarettes  . Quit date: 2022  . Years since quitting: 0.2  Smokeless Tobacco Never Used  Tobacco Comment   Quit after Heart Attack    Goals Met:  Exercise tolerated well No report of cardiac concerns or symptoms Strength training completed today  Goals Unmet:  Not Applicable  Comments: Pt started cardiac rehab today.  Pt tolerated light exercise without difficulty. VSS, telemetry-Sinus Rhythm, asymptomatic.  Medication list reconciled. Pt denies barriers to medicaiton compliance.  PSYCHOSOCIAL ASSESSMENT:  PHQ-0. Pt exhibits positive coping skills, hopeful outlook with supportive family. No psychosocial needs identified at this time, no psychosocial interventions necessary.    Pt enjoys golf, Copy and bible studies.   Pt oriented to exercise equipment and routine.    Understanding verbalized.Barnet Pall, RN,BSN 11/17/2020 9:22 AM   Dr. Fransico Him is Medical Director for Cardiac Rehab at [facility]

## 2020-11-18 ENCOUNTER — Other Ambulatory Visit: Payer: Self-pay

## 2020-11-18 ENCOUNTER — Encounter (HOSPITAL_COMMUNITY)
Admission: RE | Admit: 2020-11-18 | Discharge: 2020-11-18 | Disposition: A | Discharge status: Home / Self Care | Payer: Medicare Other | Source: Ambulatory Visit | Attending: Cardiology | Admitting: Cardiology

## 2020-11-18 DIAGNOSIS — Z79899 Other long term (current) drug therapy: Secondary | ICD-10-CM | POA: Diagnosis not present

## 2020-11-18 DIAGNOSIS — I208 Other forms of angina pectoris: Secondary | ICD-10-CM | POA: Diagnosis not present

## 2020-11-18 LAB — GLUCOSE, CAPILLARY: Glucose-Capillary: 142 mg/dL — ABNORMAL HIGH (ref 70–99)

## 2020-11-20 ENCOUNTER — Encounter (HOSPITAL_COMMUNITY)
Admission: RE | Admit: 2020-11-20 | Discharge: 2020-11-20 | Disposition: A | Discharge status: Home / Self Care | Payer: Medicare Other | Source: Ambulatory Visit | Attending: Cardiology | Admitting: Cardiology

## 2020-11-20 ENCOUNTER — Other Ambulatory Visit: Payer: Self-pay

## 2020-11-20 DIAGNOSIS — I208 Other forms of angina pectoris: Secondary | ICD-10-CM | POA: Diagnosis not present

## 2020-11-20 DIAGNOSIS — Z79899 Other long term (current) drug therapy: Secondary | ICD-10-CM | POA: Diagnosis not present

## 2020-11-23 ENCOUNTER — Other Ambulatory Visit: Payer: Self-pay

## 2020-11-23 ENCOUNTER — Encounter (HOSPITAL_COMMUNITY)
Admission: RE | Admit: 2020-11-23 | Discharge: 2020-11-23 | Disposition: A | Discharge status: Home / Self Care | Payer: Medicare Other | Source: Ambulatory Visit | Attending: Cardiology | Admitting: Cardiology

## 2020-11-23 DIAGNOSIS — I208 Other forms of angina pectoris: Secondary | ICD-10-CM | POA: Diagnosis not present

## 2020-11-23 DIAGNOSIS — Z79899 Other long term (current) drug therapy: Secondary | ICD-10-CM | POA: Diagnosis not present

## 2020-11-23 NOTE — Progress Notes (Signed)
Reviewed home exercise Rx with patient today. Pt is walking at home and playing golf. Encouraged pt to continue exercise. Pt walks 2 x/week in addition to the CRP2 program and plays golf 2 x/week. Encouraged warm-up , cool-down, and stretching. Reviewed THRR of 61-122 and keeping the RPE between 11-13. Pt encouraged to hydrate with activity. Weather parameters for temperature and humidity reviewed for safe exercise outdoors. S/S to terminate exercise reviewed as well as when to call MD vs 911. Reviewed use of NTG. Pt verbalized understanding of the home exercise RX and was provided a copy.    Lesly Rubenstein MS, ACSM-CEP, CCRP

## 2020-11-25 ENCOUNTER — Encounter (HOSPITAL_COMMUNITY)
Admission: RE | Admit: 2020-11-25 | Discharge: 2020-11-25 | Disposition: A | Discharge status: Home / Self Care | Payer: Medicare Other | Source: Ambulatory Visit | Attending: Cardiology | Admitting: Cardiology

## 2020-11-25 ENCOUNTER — Other Ambulatory Visit: Payer: Self-pay

## 2020-11-25 DIAGNOSIS — Z79899 Other long term (current) drug therapy: Secondary | ICD-10-CM | POA: Diagnosis not present

## 2020-11-25 DIAGNOSIS — I208 Other forms of angina pectoris: Secondary | ICD-10-CM | POA: Diagnosis not present

## 2020-11-25 NOTE — Progress Notes (Signed)
Adrian Baird 70 y.o. male Nutrition Note  Diagnosis: Stable Angina  Past Medical History:  Diagnosis Date  . Coronary artery disease   . Decreased libido   . Diabetes mellitus without complication ([place])   . Diverticulosis   . ED (erectile dysfunction)   . Elevated PSA    recently biopsied, seeing Dr. Jasmine December at [facility]  . Hyperplastic colon polyp   . Hypogonadism male   . Kidney stones 2007  . Personal history of urinary calculi   . RUQ abdominal pain      Medications reviewed.   Current Outpatient Medications:  .  aspirin EC 81 MG tablet, Take 1 tablet (81 mg total) by mouth daily., Disp: , Rfl:  .  Calcium Citrate (CAL-CITRATE PO), Take 500 mg by mouth daily., Disp: , Rfl:  .  famotidine (PEPCID) 40 MG tablet, Take 40 mg by mouth daily., Disp: , Rfl:  .  losartan (COZAAR) 100 MG tablet, Take 100 mg by mouth every morning. , Disp: , Rfl:  .  metoprolol succinate (TOPROL-XL) 50 MG 24 hr tablet, Take 1 tablet (50 mg total) by mouth daily., Disp: 90 tablet, Rfl: 3 .  Multiple Vitamins-Minerals (MULTIVITAMIN WITH MINERALS) tablet, Take 1 tablet by mouth daily., Disp: , Rfl:  .  nitroGLYCERIN (NITROSTAT) 0.4 MG SL tablet, Place 1 tablet (0.4 mg total) under the tongue every 5 (five) minutes as needed for chest pain., Disp: 90 tablet, Rfl: 3 .  rosuvastatin (CRESTOR) 10 MG tablet, Take 10 mg by mouth daily., Disp: , Rfl:  .  tadalafil (CIALIS) 10 MG tablet, Take 10 mg by mouth as needed for erectile dysfunction., Disp: , Rfl:  .  tamsulosin (FLOMAX) 0.4 MG CAPS, Take 0.4 mg by mouth daily., Disp: , Rfl:  .  ticagrelor (BRILINTA) 90 MG TABS tablet, Take 1 tablet (90 mg total) by mouth 2 (two) times daily., Disp: 180 tablet, Rfl: 3   Ht Readings from Last 1 Encounters:  11/10/20 5' 10.25" (1.784 m)     Wt Readings from Last 3 Encounters:  11/10/20 185 lb 10 oz (84.2 kg)  11/02/20 188 lb (85.3 kg)  09/21/20 187 lb (84.8 kg)     There is no height or weight on file to  calculate BMI.   Social History   Tobacco Use  Smoking Status Former Smoker  . Packs/day: 0.25  . Years: 20.00  . Pack years: 5.00  . Types: Cigarettes  . Quit date: 2022  . Years since quitting: 0.2  Smokeless Tobacco Never Used  Tobacco Comment   Quit after Heart Attack     Lab Results  Component Value Date   CHOL 180 02/27/2013   Lab Results  Component Value Date   HDL 63 02/27/2013   Lab Results  Component Value Date   LDLCALC 101 (H) 02/27/2013   Lab Results  Component Value Date   TRIG 78 02/27/2013     No results found for: HGBA1C   CBG (last 3)  No results for input(s): GLUCAP in the last 72 hours.   Nutrition Note  Spoke with pt. Nutrition Plan and Nutrition Survey goals reviewed with pt.  Pt has Type 2 Diabetes -- diet controlled. Last A1c indicates blood glucose well-controlled. Pt A1C has fluctuated between 6.3-6.5 per pt report. Last A1C was 6.5. Reviewed diabetes prevention including daily exercise, annual A1C monitoring, and healthy diet. No sugary beverages.   Per discussion, pt does use canned/convenience foods often. Pt does not add salt to  food. Pt does eat out frequently. He has been reading labels and trying to choose lower sodium foods. We reviewed daily sodium recommendations and label reading.  He enjoys sweet food and has tried to swap to sugar free pudding and small portions of moravian cookies. He always chooses grilled chicken at restaurants. He typically eats 1 serving veggies daily. He chooses some whole grains. He eats fish >2 times per week for lunch. Reviewed lipid panel.  Drinks 1-2 glasses red wine daily. Smokes 1-2 cigarettes each week.  He has received nutrition education from a RD in the past. He is aware of label reading, portion sizes, and plate method nutrition tips.   Pt expressed understanding of the information reviewed.    Nutrition Diagnosis ? Food-and nutrition-related knowledge deficit related to lack of exposure  to information as related to diagnosis of: ? CVD ? Type 2 Diabetes  Nutrition Intervention ? Pt's individual nutrition plan reviewed with pt. ? Benefits of adopting Heart Healthy diet discussed when Picture Your Plate reviewed.   ? Continue client-centered nutrition education by RD, as part of interdisciplinary care.  Goal(s) ? Pt to build a healthy plate including vegetables, fruits, whole grains, and low-fat dairy products in a heart healthy meal plan. ? Pt to eat out <2 times per week ? Pt to continue to reduce sodium to <2300 mg/day   Plan:   Will provide client-centered nutrition education as part of interdisciplinary care  Monitor and evaluate progress toward nutrition goal with team.   Michaele Offer, MS, RDN, LDN

## 2020-11-27 ENCOUNTER — Encounter (HOSPITAL_COMMUNITY)
Admission: RE | Admit: 2020-11-27 | Discharge: 2020-11-27 | Disposition: A | Discharge status: Home / Self Care | Payer: Medicare Other | Source: Ambulatory Visit | Attending: Cardiology | Admitting: Cardiology

## 2020-11-27 ENCOUNTER — Other Ambulatory Visit: Payer: Self-pay

## 2020-11-27 DIAGNOSIS — I208 Other forms of angina pectoris: Secondary | ICD-10-CM

## 2020-11-27 DIAGNOSIS — Z79899 Other long term (current) drug therapy: Secondary | ICD-10-CM | POA: Diagnosis not present

## 2020-11-30 ENCOUNTER — Encounter (HOSPITAL_COMMUNITY)
Admission: RE | Admit: 2020-11-30 | Discharge: 2020-11-30 | Disposition: A | Discharge status: Home / Self Care | Payer: Medicare Other | Source: Ambulatory Visit | Attending: Cardiology | Admitting: Cardiology

## 2020-11-30 ENCOUNTER — Other Ambulatory Visit: Payer: Self-pay

## 2020-11-30 DIAGNOSIS — Z79899 Other long term (current) drug therapy: Secondary | ICD-10-CM | POA: Diagnosis not present

## 2020-11-30 DIAGNOSIS — I208 Other forms of angina pectoris: Secondary | ICD-10-CM

## 2020-12-01 NOTE — Progress Notes (Signed)
Cardiac Individual Treatment Plan  Patient Details  Name: Adrian Baird MRN: [id number] Date of Birth: 1951-04-01 Referring Provider:   Flowsheet Row CARDIAC REHAB PHASE II ORIENTATION from 11/10/2020 in [place]  Referring Provider Vernell Leep, MD      Initial Encounter Date:  [place] PHASE II ORIENTATION from 11/10/2020 in [place]  Date 11/10/20      Visit Diagnosis: Stable angina ([place]) S/P Cath 08/06/20 Medical Treatment  Patient's Home Medications on Admission:  Current Outpatient Medications:  .  aspirin EC 81 MG tablet, Take 1 tablet (81 mg total) by mouth daily., Disp: , Rfl:  .  Calcium Citrate (CAL-CITRATE PO), Take 500 mg by mouth daily., Disp: , Rfl:  .  famotidine (PEPCID) 40 MG tablet, Take 40 mg by mouth daily., Disp: , Rfl:  .  losartan (COZAAR) 100 MG tablet, Take 100 mg by mouth every morning. , Disp: , Rfl:  .  metoprolol succinate (TOPROL-XL) 50 MG 24 hr tablet, Take 1 tablet (50 mg total) by mouth daily., Disp: 90 tablet, Rfl: 3 .  Multiple Vitamins-Minerals (MULTIVITAMIN WITH MINERALS) tablet, Take 1 tablet by mouth daily., Disp: , Rfl:  .  nitroGLYCERIN (NITROSTAT) 0.4 MG SL tablet, Place 1 tablet (0.4 mg total) under the tongue every 5 (five) minutes as needed for chest pain., Disp: 90 tablet, Rfl: 3 .  rosuvastatin (CRESTOR) 10 MG tablet, Take 10 mg by mouth daily., Disp: , Rfl:  .  tadalafil (CIALIS) 10 MG tablet, Take 10 mg by mouth as needed for erectile dysfunction., Disp: , Rfl:  .  tamsulosin (FLOMAX) 0.4 MG CAPS, Take 0.4 mg by mouth daily., Disp: , Rfl:  .  ticagrelor (BRILINTA) 90 MG TABS tablet, Take 1 tablet (90 mg total) by mouth 2 (two) times daily., Disp: 180 tablet, Rfl: 3  Past Medical History: Past Medical History:  Diagnosis Date  . Coronary artery disease   . Decreased libido   . Diabetes mellitus without complication ([place])   . Diverticulosis    . ED (erectile dysfunction)   . Elevated PSA    recently biopsied, seeing Dr. Jasmine December at [facility]  . Hyperplastic colon polyp   . Hypogonadism male   . Kidney stones 2007  . Personal history of urinary calculi   . RUQ abdominal pain     Tobacco Use: Social History   Tobacco Use  Smoking Status Former Smoker  . Packs/day: 0.25  . Years: 20.00  . Pack years: 5.00  . Types: Cigarettes  . Quit date: 2022  . Years since quitting: 0.3  Smokeless Tobacco Never Used  Tobacco Comment   Quit after Heart Attack    Labs: Recent Review Flowsheet Data    Labs for ITP Cardiac and Pulmonary Rehab Latest Ref Rng & Units 02/27/2013   Cholestrol 0 - 200 mg/dL 180   LDLCALC 0 - 99 mg/dL 101(H)   HDL >39 mg/dL 63   Trlycerides <150 mg/dL 78      Capillary Blood Glucose: Lab Results  Component Value Date   GLUCAP 142 (H) 11/18/2020   GLUCAP 86 11/16/2020   GLUCAP 160 (H) 11/16/2020     Exercise Target Goals: Exercise Program Goal: Individual exercise prescription set using results from initial 6 min walk test and THRR while considering  patient's activity barriers and safety.   Exercise Prescription Goal: Starting with aerobic activity 30 plus minutes a day, 3 days per week for initial  exercise prescription. Provide home exercise prescription and guidelines that participant acknowledges understanding prior to discharge.  Activity Barriers & Risk Stratification:  Activity Barriers & Cardiac Risk Stratification - 11/10/20 1549      Activity Barriers & Cardiac Risk Stratification   Activity Barriers None    Cardiac Risk Stratification Moderate           6 Minute Walk:  6 Minute Walk    Row Name 11/10/20 1508         6 Minute Walk   Phase Initial     Distance 1436 feet     Walk Time 6 minutes     # of Rest Breaks 0     MPH 2.72     METS 31.3     RPE 8     Perceived Dyspnea  0     VO2 Peak 10.96     Symptoms No     Resting HR 65 bpm     Resting BP 108/70      Resting Oxygen Saturation  99 %     Exercise Oxygen Saturation  during 6 min walk 99 %     Max Ex. HR 74 bpm     Max Ex. BP 134/62     2 Minute Post BP 118/64            Oxygen Initial Assessment:   Oxygen Re-Evaluation:   Oxygen Discharge (Final Oxygen Re-Evaluation):   Initial Exercise Prescription:  Initial Exercise Prescription - 11/10/20 1500      Date of Initial Exercise RX and Referring Provider   Date 11/10/20    Referring Provider Vernell Leep, MD    Expected Discharge Date 01/08/21      Treadmill   MPH 2.8    Grade 1    Minutes 15    METs 3.53      NuStep   Level 3    SPM 75    Minutes 15    METs 2.4      Prescription Details   Frequency (times per week) 3    Duration Progress to 30 minutes of continuous aerobic without signs/symptoms of physical distress      Intensity   THRR 40-80% of Max Heartrate 61-122    Ratings of Perceived Exertion 11-13    Perceived Dyspnea 0-4      Progression   Progression Continue progressive overload as per policy without signs/symptoms or physical distress.      Resistance Training   Training Prescription Yes    Weight 5 lbs    Reps 10-15           Perform Capillary Blood Glucose checks as needed.  Exercise Prescription Changes:  Exercise Prescription Changes    Row Name 11/16/20 1430 11/23/20 1500 11/30/20 1350         Response to Exercise   Blood Pressure (Admit) 106/56 118/62 110/60     Blood Pressure (Exercise) 134/66 118/72 118/62     Blood Pressure (Exit) 120/60 102/68 104/56     Heart Rate (Admit) 75 bpm 72 bpm 70 bpm     Heart Rate (Exercise) 82 bpm 84 bpm 82 bpm     Heart Rate (Exit) 75 bpm 72 bpm 68 bpm     Rating of Perceived Exertion (Exercise) 11 12 12      Symptoms None None None     Comments Pt's first day of exercise in the CRP2 program. Reviewed Home exercsie Rx Reviewed METS  Duration Progress to 30 minutes of  aerobic without signs/symptoms of physical distress Continue  with 30 min of aerobic exercise without signs/symptoms of physical distress. Continue with 30 min of aerobic exercise without signs/symptoms of physical distress.     Intensity THRR unchanged THRR unchanged THRR unchanged           Progression   Progression Continue to progress workloads to maintain intensity without signs/symptoms of physical distress. Continue to progress workloads to maintain intensity without signs/symptoms of physical distress. Continue to progress workloads to maintain intensity without signs/symptoms of physical distress.     Average METs 2.9 2.9 3.2           Resistance Training   Training Prescription Yes Yes Yes     Weight 5 lbs 5 lbs 5 lbs     Reps 10-15 10-15 10-15     Time 10 Minutes 10 Minutes 10 Minutes           Interval Training   Interval Training No No No           Treadmill   MPH 2.6 2.6 2.6     Grade 1 1 2      Minutes 15 15 15      METs 3.35 3.35 3.71           NuStep   Level 3 4 4      SPM 75 75 85     Minutes 15 15 15      METs 2.4 2.3 2.7           Home Exercise Plan   Plans to continue exercise at -- Home (comment) Home (comment)     Frequency -- Add 2 additional days to program exercise sessions. Add 2 additional days to program exercise sessions.     Initial Home Exercises Provided -- 11/23/20 11/23/20            Exercise Comments:  Exercise Comments    Row Name 11/16/20 1630 11/23/20 1558 11/30/20 1350 12/01/20 0750     Exercise Comments Pt's first day in hte CRP2 program. Pt tolerated session well with no complaints and is off to a good start. Reviewed home exercise Rx with patient today. Pt verbaized understnading of the home exercise Rx and was provided a copy. Reviewed METs with patient. Pt is making good progess in the CRP2 program. --           Exercise Goals and Review:  Exercise Goals    Row Name 11/10/20 1553             Exercise Goals   Increase Physical Activity Yes       Intervention Provide advice,  education, support and counseling about physical activity/exercise needs.;Develop an individualized exercise prescription for aerobic and resistive training based on initial evaluation findings, risk stratification, comorbidities and participant's personal goals.       Expected Outcomes Short Term: Attend rehab on a regular basis to increase amount of physical activity.;Long Term: Add in home exercise to make exercise part of routine and to increase amount of physical activity.;Long Term: Exercising regularly at least 3-5 days a week.       Increase Strength and Stamina Yes       Intervention Provide advice, education, support and counseling about physical activity/exercise needs.;Develop an individualized exercise prescription for aerobic and resistive training based on initial evaluation findings, risk stratification, comorbidities and participant's personal goals.       Expected Outcomes Short Term: Increase workloads from  initial exercise prescription for resistance, speed, and METs.;Long Term: Improve cardiorespiratory fitness, muscular endurance and strength as measured by increased METs and functional capacity (6MWT);Short Term: Perform resistance training exercises routinely during rehab and add in resistance training at home       Able to understand and use rate of perceived exertion (RPE) scale Yes       Intervention Provide education and explanation on how to use RPE scale       Expected Outcomes Long Term:  Able to use RPE to guide intensity level when exercising independently;Short Term: Able to use RPE daily in rehab to express subjective intensity level       Knowledge and understanding of Target Heart Rate Range (THRR) Yes       Intervention Provide education and explanation of THRR including how the numbers were predicted and where they are located for reference       Expected Outcomes Short Term: Able to state/look up THRR;Long Term: Able to use THRR to govern intensity when exercising  independently;Short Term: Able to use daily as guideline for intensity in rehab       Understanding of Exercise Prescription Yes       Intervention Provide education, explanation, and written materials on patient's individual exercise prescription       Expected Outcomes Short Term: Able to explain program exercise prescription;Long Term: Able to explain home exercise prescription to exercise independently              Exercise Goals Re-Evaluation :  Exercise Goals Re-Evaluation    [place] Name 11/16/20 1430 11/23/20 1557           Exercise Goal Re-Evaluation   Exercise Goals Review Increase Physical Activity;Increase Strength and Stamina;Able to understand and use rate of perceived exertion (RPE) scale;Knowledge and understanding of Target Heart Rate Range (THRR);Understanding of Exercise Prescription Increase Physical Activity;Increase Strength and Stamina;Able to understand and use rate of perceived exertion (RPE) scale;Knowledge and understanding of Target Heart Rate Range (THRR);Able to check pulse independently;Understanding of Exercise Prescription      Comments Pt's first day in the CRP2 program. Pt understands the THRR, RPE sclae and Exercise Rx. Reviewed home exericse Rx with patient today. Pt is already walking 2x/week for 30-45 minutes in addtion to playing golf 2x/week. Pt is active everday.      Expected Outcomes Will continue to monitor patient and progress as tolerated. Pt will continue to exercise at home on his own.              Discharge Exercise Prescription (Final Exercise Prescription Changes):  Exercise Prescription Changes - 11/30/20 1350      Response to Exercise   Blood Pressure (Admit) 110/60    Blood Pressure (Exercise) 118/62    Blood Pressure (Exit) 104/56    Heart Rate (Admit) 70 bpm    Heart Rate (Exercise) 82 bpm    Heart Rate (Exit) 68 bpm    Rating of Perceived Exertion (Exercise) 12    Symptoms None    Comments Reviewed METS    Duration Continue  with 30 min of aerobic exercise without signs/symptoms of physical distress.    Intensity THRR unchanged      Progression   Progression Continue to progress workloads to maintain intensity without signs/symptoms of physical distress.    Average METs 3.2      Resistance Training   Training Prescription Yes    Weight 5 lbs    Reps 10-15    Time  10 Minutes      Interval Training   Interval Training No      Treadmill   MPH 2.6    Grade 2    Minutes 15    METs 3.71      NuStep   Level 4    SPM 85    Minutes 15    METs 2.7      Home Exercise Plan   Plans to continue exercise at Home (comment)    Frequency Add 2 additional days to program exercise sessions.    Initial Home Exercises Provided 11/23/20           Nutrition:  Target Goals: Understanding of nutrition guidelines, daily intake of sodium 1500mg , cholesterol 200mg , calories 30% from fat and 7% or less from saturated fats, daily to have 5 or more servings of fruits and vegetables.  Biometrics:  Pre Biometrics - 11/10/20 1400      Pre Biometrics   Waist Circumference 41 inches    Hip Circumference 41.5 inches    Waist to Hip Ratio 0.99 %    Triceps Skinfold 16 mm    % Body Fat 27.6 %    Grip Strength 48 kg    Flexibility 9.25 in    Single Leg Stand 30 seconds            Nutrition Therapy Plan and Nutrition Goals:  Nutrition Therapy & Goals - 11/26/20 0931      Nutrition Therapy   Diet TLC; carb modified    Drug/Food Interactions Statins/Certain Fruits      Personal Nutrition Goals   Nutrition Goal Pt to build a healthy plate including vegetables, fruits, whole grains, and low-fat dairy products in a heart healthy meal plan.    Personal Goal #2 Pt to eat out <2 times per week    Personal Goal #3 Pt to continue to reduce sodium to <2300 mg/day      Intervention Plan   Intervention Prescribe, educate and counsel regarding individualized specific dietary modifications aiming towards targeted core  components such as weight, hypertension, lipid management, diabetes, heart failure and other comorbidities.;Nutrition handout(s) given to patient.    Expected Outcomes Long Term Goal: Adherence to prescribed nutrition plan.;Short Term Goal: Understand basic principles of dietary content, such as calories, fat, sodium, cholesterol and nutrients.           Nutrition Assessments:  MEDIFICTS Score Key:  ?70 Need to make dietary changes   40-70 Heart Healthy Diet  ? 40 Therapeutic Level Cholesterol Diet  Flowsheet Row CARDIAC REHAB PHASE II EXERCISE from 11/25/2020 in [place]  Picture Your Plate Total Score on Admission 56     Picture Your Plate Scores:  <97 Unhealthy dietary pattern with much room for improvement.  41-50 Dietary pattern unlikely to meet recommendations for good health and room for improvement.  51-60 More healthful dietary pattern, with some room for improvement.   >60 Healthy dietary pattern, although there may be some specific behaviors that could be improved.    Nutrition Goals Re-Evaluation:  Nutrition Goals Re-Evaluation    Row Name 11/26/20 0932             Goals   Current Weight 185 lb (83.9 kg)       Nutrition Goal Pt to build a healthy plate including vegetables, fruits, whole grains, and low-fat dairy products in a heart healthy meal plan.  Personal Goal #2 Re-Evaluation   Personal Goal #2 Pt to eat out <2 times per week               Personal Goal #3 Re-Evaluation   Personal Goal #3 Pt to continue to reduce sodium to <2300 mg/day              Nutrition Goals Discharge (Final Nutrition Goals Re-Evaluation):  Nutrition Goals Re-Evaluation - 11/26/20 0932      Goals   Current Weight 185 lb (83.9 kg)    Nutrition Goal Pt to build a healthy plate including vegetables, fruits, whole grains, and low-fat dairy products in a heart healthy meal plan.      Personal Goal #2 Re-Evaluation    Personal Goal #2 Pt to eat out <2 times per week      Personal Goal #3 Re-Evaluation   Personal Goal #3 Pt to continue to reduce sodium to <2300 mg/day           Psychosocial: Target Goals: Acknowledge presence or absence of significant depression and/or stress, maximize coping skills, provide positive support system. Participant is able to verbalize types and ability to use techniques and skills needed for reducing stress and depression.  Initial Review & Psychosocial Screening:  Initial Psych Review & Screening - 11/10/20 1604      Initial Review   Current issues with None Identified      Family Dynamics   Good Support System? Yes   [place] has his wfie and children for support     Barriers   Psychosocial barriers to participate in program There are no identifiable barriers or psychosocial needs.      Screening Interventions   Interventions Encouraged to exercise           Quality of Life Scores:  Quality of Life - 11/10/20 1544      Quality of Life   Select Quality of Life      Quality of Life Scores   Health/Function Pre 28.83 %    Socioeconomic Pre 27.5 %    Psych/Spiritual Pre 28.21 %    Family Pre 27.6 %    GLOBAL Pre 28.25 %          Scores of 19 and below usually indicate a poorer quality of life in these areas.  A difference of  2-3 points is a clinically meaningful difference.  A difference of 2-3 points in the total score of the Quality of Life Index has been associated with significant improvement in overall quality of life, self-image, physical symptoms, and general health in studies assessing change in quality of life.  PHQ-9: Recent Review Flowsheet Data    Depression screen [facility] 2/9 11/10/2020 07/16/2018 02/27/2013   Decreased Interest 0 0 1   Down, Depressed, Hopeless 0 0 1   PHQ - 2 Score 0 0 2     Interpretation of Total Score  Total Score Depression Severity:  1-4 = Minimal depression, 5-9 = Mild depression, 10-14 = Moderate depression, 15-19  = Moderately severe depression, 20-27 = Severe depression   Psychosocial Evaluation and Intervention:   Psychosocial Re-Evaluation:  Psychosocial Re-Evaluation    Row Name 12/01/20 1430             Psychosocial Re-Evaluation   Current issues with None Identified       Interventions Encouraged to attend Cardiac Rehabilitation for the exercise       Continue Psychosocial Services  No Follow up required  Psychosocial Discharge (Final Psychosocial Re-Evaluation):  Psychosocial Re-Evaluation - 12/01/20 1430      Psychosocial Re-Evaluation   Current issues with None Identified    Interventions Encouraged to attend Cardiac Rehabilitation for the exercise    Continue Psychosocial Services  No Follow up required           Vocational Rehabilitation: Provide vocational rehab assistance to qualifying candidates.   Vocational Rehab Evaluation & Intervention:  Vocational Rehab - 11/10/20 1605      Initial Vocational Rehab Evaluation & Intervention   Assessment shows need for Vocational Rehabilitation No   Charleson is retired and does not need vocatinal rehab at this time          Education: Education Goals: Education classes will be provided on a weekly basis, covering required topics. Participant will state understanding/return demonstration of topics presented.  Learning Barriers/Preferences:  Learning Barriers/Preferences - 11/10/20 1545      Learning Barriers/Preferences   Learning Barriers Sight;Hearing   Wears glasses, voices might need hearing aid   Learning Preferences Written Material;Skilled Demonstration;Individual Instruction;Group Instruction           Education Topics: Hypertension, Hypertension Reduction -Define heart disease and high blood pressure. Discus how high blood pressure affects the body and ways to reduce high blood pressure.   Exercise and Your Heart -Discuss why it is important to exercise, the FITT principles of exercise,  normal and abnormal responses to exercise, and how to exercise safely.   Angina -Discuss definition of angina, causes of angina, treatment of angina, and how to decrease risk of having angina.   Cardiac Medications -Review what the following cardiac medications are used for, how they affect the body, and side effects that may occur when taking the medications.  Medications include Aspirin, Beta blockers, calcium channel blockers, ACE Inhibitors, angiotensin receptor blockers, diuretics, digoxin, and antihyperlipidemics.   Congestive Heart Failure -Discuss the definition of CHF, how to live with CHF, the signs and symptoms of CHF, and how keep track of weight and sodium intake.   Heart Disease and Intimacy -Discus the effect sexual activity has on the heart, how changes occur during intimacy as we age, and safety during sexual activity.   Smoking Cessation / COPD -Discuss different methods to quit smoking, the health benefits of quitting smoking, and the definition of COPD.   Nutrition I: Fats -Discuss the types of cholesterol, what cholesterol does to the heart, and how cholesterol levels can be controlled.   Nutrition II: Labels -Discuss the different components of food labels and how to read food label   Heart Parts/Heart Disease and PAD -Discuss the anatomy of the heart, the pathway of blood circulation through the heart, and these are affected by heart disease.   Stress I: Signs and Symptoms -Discuss the causes of stress, how stress may lead to anxiety and depression, and ways to limit stress.   Stress II: Relaxation -Discuss different types of relaxation techniques to limit stress.   Warning Signs of Stroke / TIA -Discuss definition of a stroke, what the signs and symptoms are of a stroke, and how to identify when someone is having stroke.   Knowledge Questionnaire Score:  Knowledge Questionnaire Score - 11/10/20 1546      Knowledge Questionnaire Score   Pre  Score 25/28           Core Components/Risk Factors/Patient Goals at Admission:  Personal Goals and Risk Factors at Admission - 11/10/20 1546      Core Components/Risk Factors/Patient  Goals on Admission    Weight Management Weight Maintenance    Tobacco Cessation Yes    Number of packs per day 1-2 cigarettes per week    Intervention Assist the participant in steps to quit. Provide individualized education and counseling about committing to Tobacco Cessation, relapse prevention, and pharmacological support that can be provided by physician.;Advice worker, assist with locating and accessing local/national Quit Smoking programs, and support quit date choice.   Patient given phone number to 1-800-quit-now   Expected Outcomes Short Term: Will demonstrate readiness to quit, by selecting a quit date.;Short Term: Will quit all tobacco product use, adhering to prevention of relapse plan.;Long Term: Complete abstinence from all tobacco products for at least 12 months from quit date.    Diabetes Yes    Intervention Provide education about signs/symptoms and action to take for hypo/hyperglycemia.;Provide education about proper nutrition, including hydration, and aerobic/resistive exercise prescription along with prescribed medications to achieve blood glucose in normal ranges: Fasting glucose 65-99 mg/dL    Expected Outcomes Short Term: Participant verbalizes understanding of the signs/symptoms and immediate care of hyper/hypoglycemia, proper foot care and importance of medication, aerobic/resistive exercise and nutrition plan for blood glucose control.;Long Term: Attainment of HbA1C < 7%.    Hypertension Yes    Intervention Provide education on lifestyle modifcations including regular physical activity/exercise, weight management, moderate sodium restriction and increased consumption of fresh fruit, vegetables, and low fat dairy, alcohol moderation, and smoking cessation.;Monitor prescription  use compliance.    Expected Outcomes Short Term: Continued assessment and intervention until BP is < 140/22mm HG in hypertensive participants. < 130/24mm HG in hypertensive participants with diabetes, heart failure or chronic kidney disease.;Long Term: Maintenance of blood pressure at goal levels.    Lipids Yes    Intervention Provide education and support for participant on nutrition & aerobic/resistive exercise along with prescribed medications to achieve LDL 70mg , HDL >40mg .    Expected Outcomes Short Term: Participant states understanding of desired cholesterol values and is compliant with medications prescribed. Participant is following exercise prescription and nutrition guidelines.;Long Term: Cholesterol controlled with medications as prescribed, with individualized exercise RX and with personalized nutrition plan. Value goals: LDL < 70mg , HDL > 40 mg.           Core Components/Risk Factors/Patient Goals Review:   Goals and Risk Factor Review    Row Name 12/01/20 1432             Core Components/Risk Factors/Patient Goals Review   Personal Goals Review Weight Management/Obesity;Hypertension;Tobacco Cessation;Lipids;Diabetes       Review Jesiah is doing well with exercise. Antwon vital signs have been stable. Will continue to encourage smoking cessation. Diabetes is diet controlled       Expected Outcomes Elmo will continue to participate in cardiac rehab for exercise, nutrition and lifestyle modifications              Core Components/Risk Factors/Patient Goals at Discharge (Final Review):   Goals and Risk Factor Review - 12/01/20 1432      Core Components/Risk Factors/Patient Goals Review   Personal Goals Review Weight Management/Obesity;Hypertension;Tobacco Cessation;Lipids;Diabetes    Review Nyshawn is doing well with exercise. Ben vital signs have been stable. Will continue to encourage smoking cessation. Diabetes is diet controlled    Expected Outcomes Jessie  will continue to participate in cardiac rehab for exercise, nutrition and lifestyle modifications           ITP Comments:  ITP Comments    Row Name  11/10/20 1600 12/01/20 1428         ITP Comments Dr Fransico Him MD, Medical Director 30 Day ITP Review. Naryan has good attendance and participation in phase 2 cardiac rehab.             Comments: See ITP comments.Barnet Pall, RN,BSN 12/01/2020 2:39 PM

## 2020-12-02 ENCOUNTER — Other Ambulatory Visit: Payer: Self-pay

## 2020-12-02 ENCOUNTER — Encounter (HOSPITAL_COMMUNITY)
Admission: RE | Admit: 2020-12-02 | Discharge: 2020-12-02 | Disposition: A | Discharge status: Home / Self Care | Payer: Medicare Other | Source: Ambulatory Visit | Attending: Cardiology | Admitting: Cardiology

## 2020-12-02 DIAGNOSIS — Z79899 Other long term (current) drug therapy: Secondary | ICD-10-CM | POA: Diagnosis not present

## 2020-12-02 DIAGNOSIS — I208 Other forms of angina pectoris: Secondary | ICD-10-CM | POA: Diagnosis not present

## 2020-12-04 ENCOUNTER — Other Ambulatory Visit: Payer: Self-pay

## 2020-12-04 ENCOUNTER — Encounter (HOSPITAL_COMMUNITY)
Admission: RE | Admit: 2020-12-04 | Discharge: 2020-12-04 | Disposition: A | Discharge status: Home / Self Care | Payer: Medicare Other | Source: Ambulatory Visit | Attending: Cardiology | Admitting: Cardiology

## 2020-12-04 DIAGNOSIS — I2089 Other forms of angina pectoris: Secondary | ICD-10-CM

## 2020-12-04 DIAGNOSIS — I208 Other forms of angina pectoris: Secondary | ICD-10-CM | POA: Diagnosis not present

## 2020-12-04 DIAGNOSIS — Z79899 Other long term (current) drug therapy: Secondary | ICD-10-CM | POA: Diagnosis not present

## 2020-12-07 ENCOUNTER — Encounter (HOSPITAL_COMMUNITY): Payer: Medicare Other

## 2020-12-09 ENCOUNTER — Encounter (HOSPITAL_COMMUNITY): Payer: Medicare Other

## 2020-12-11 ENCOUNTER — Other Ambulatory Visit: Payer: Self-pay

## 2020-12-11 ENCOUNTER — Encounter (HOSPITAL_COMMUNITY)
Admission: RE | Admit: 2020-12-11 | Discharge: 2020-12-11 | Disposition: A | Discharge status: Home / Self Care | Payer: Medicare Other | Source: Ambulatory Visit | Attending: Cardiology | Admitting: Cardiology

## 2020-12-11 DIAGNOSIS — I208 Other forms of angina pectoris: Secondary | ICD-10-CM | POA: Diagnosis not present

## 2020-12-11 DIAGNOSIS — I2089 Other forms of angina pectoris: Secondary | ICD-10-CM

## 2020-12-14 ENCOUNTER — Encounter (HOSPITAL_COMMUNITY): Payer: Medicare Other

## 2020-12-14 ENCOUNTER — Telehealth (HOSPITAL_COMMUNITY): Payer: Self-pay | Admitting: Internal Medicine

## 2020-12-14 DIAGNOSIS — Z20822 Contact with and (suspected) exposure to covid-19: Secondary | ICD-10-CM | POA: Diagnosis not present

## 2020-12-16 ENCOUNTER — Other Ambulatory Visit: Payer: Self-pay

## 2020-12-16 ENCOUNTER — Encounter (HOSPITAL_COMMUNITY)
Admission: RE | Admit: 2020-12-16 | Discharge: 2020-12-16 | Disposition: A | Discharge status: Home / Self Care | Payer: Medicare Other | Source: Ambulatory Visit | Attending: Cardiology | Admitting: Cardiology

## 2020-12-16 DIAGNOSIS — I208 Other forms of angina pectoris: Secondary | ICD-10-CM

## 2020-12-16 DIAGNOSIS — I2089 Other forms of angina pectoris: Secondary | ICD-10-CM

## 2020-12-18 ENCOUNTER — Encounter (HOSPITAL_COMMUNITY)
Admission: RE | Admit: 2020-12-18 | Discharge: 2020-12-18 | Disposition: A | Discharge status: Home / Self Care | Payer: Medicare Other | Source: Ambulatory Visit | Attending: Cardiology | Admitting: Cardiology

## 2020-12-18 ENCOUNTER — Other Ambulatory Visit: Payer: Self-pay

## 2020-12-18 DIAGNOSIS — I208 Other forms of angina pectoris: Secondary | ICD-10-CM | POA: Diagnosis not present

## 2020-12-21 ENCOUNTER — Encounter (HOSPITAL_COMMUNITY)
Admission: RE | Admit: 2020-12-21 | Discharge: 2020-12-21 | Disposition: A | Discharge status: Home / Self Care | Payer: Medicare Other | Source: Ambulatory Visit | Attending: Cardiology | Admitting: Cardiology

## 2020-12-21 ENCOUNTER — Other Ambulatory Visit: Payer: Self-pay

## 2020-12-21 DIAGNOSIS — I208 Other forms of angina pectoris: Secondary | ICD-10-CM | POA: Diagnosis not present

## 2020-12-21 NOTE — Progress Notes (Signed)
Nutrition Note  Reviewed goals with pt. He has maintained a heart healthy diet. Reviewed diet recall.  Breakfast: oats OR 2 eggs/1sausage/1 ww toast OR cheerios Lunch: Grilled chicken sandwich or wrap, corn chips, fruit Dinner: chicken OR lean steak 2x/week, salad, 1/2 potato  No SSB.  Pt still checking CBGs minimum 1x/week. Fasting CBG 130 mg/dl. He was having fasting CBGs in 120's mg/dl. Discussed night time snacks. Reviewed diabetes pathophysiology. Reinforced carbohydrates impact on blood sugar and insulin response. Reviewed reducing carbs as well as sustainable diet changes. Pt is still smoking. Currently 2-3 cigarettes per day.  Allowed time of questions and provided feedback.    Nutrition Diagnosis   Food-and nutrition-related knowledge deficit related to lack of exposure to information as related to diagnosis of: ? CVD ? Type 2 Diabetes  Nutrition Intervention   Pt's individual nutrition plan reviewed with pt.  Benefits of adopting Heart Healthy diet discussed when Picture Your Plate reviewed.                    Continue client-centered nutrition education by RD, as part of interdisciplinary care.  Goal(s)  Pt to build a healthy plate including vegetables, fruits, whole grains, and low-fat dairy products in a heart healthy meal plan.  Pt to eat out <2 times per week  Pt to continue to reduce sodium to <2300 mg/day   Plan:  Will provide client-centered nutrition education as part of interdisciplinary care  Monitor and evaluate progress toward nutrition goal with team.   Michaele Offer, MS, RDN, LDN

## 2020-12-23 ENCOUNTER — Encounter (HOSPITAL_COMMUNITY): Payer: Medicare Other

## 2020-12-25 ENCOUNTER — Encounter (HOSPITAL_COMMUNITY)
Admission: RE | Admit: 2020-12-25 | Discharge: 2020-12-25 | Disposition: A | Discharge status: Home / Self Care | Payer: Medicare Other | Source: Ambulatory Visit | Attending: Cardiology | Admitting: Cardiology

## 2020-12-25 ENCOUNTER — Other Ambulatory Visit: Payer: Self-pay

## 2020-12-25 DIAGNOSIS — I208 Other forms of angina pectoris: Secondary | ICD-10-CM

## 2020-12-28 ENCOUNTER — Encounter (HOSPITAL_COMMUNITY)
Admission: RE | Admit: 2020-12-28 | Discharge: 2020-12-28 | Disposition: A | Discharge status: Home / Self Care | Payer: Medicare Other | Source: Ambulatory Visit | Attending: Cardiology | Admitting: Cardiology

## 2020-12-28 ENCOUNTER — Other Ambulatory Visit: Payer: Self-pay

## 2020-12-28 DIAGNOSIS — I208 Other forms of angina pectoris: Secondary | ICD-10-CM | POA: Diagnosis not present

## 2020-12-29 NOTE — Progress Notes (Signed)
Cardiac Individual Treatment Plan  Patient Details  Name: Adrian Baird MRN: [id number] Date of Birth: 1951-04-01 Referring Provider:   Flowsheet Row CARDIAC REHAB PHASE II ORIENTATION from 11/10/2020 in [place]  Referring Provider Vernell Leep, MD      Initial Encounter Date:  [place] PHASE II ORIENTATION from 11/10/2020 in [place]  Date 11/10/20      Visit Diagnosis: Stable angina ([place]) S/P Cath 08/06/20 Medical Treatment  Patient's Home Medications on Admission:  Current Outpatient Medications:  .  aspirin EC 81 MG tablet, Take 1 tablet (81 mg total) by mouth daily., Disp: , Rfl:  .  Calcium Citrate (CAL-CITRATE PO), Take 500 mg by mouth daily., Disp: , Rfl:  .  famotidine (PEPCID) 40 MG tablet, Take 40 mg by mouth daily., Disp: , Rfl:  .  losartan (COZAAR) 100 MG tablet, Take 100 mg by mouth every morning. , Disp: , Rfl:  .  metoprolol succinate (TOPROL-XL) 50 MG 24 hr tablet, Take 1 tablet (50 mg total) by mouth daily., Disp: 90 tablet, Rfl: 3 .  Multiple Vitamins-Minerals (MULTIVITAMIN WITH MINERALS) tablet, Take 1 tablet by mouth daily., Disp: , Rfl:  .  nitroGLYCERIN (NITROSTAT) 0.4 MG SL tablet, Place 1 tablet (0.4 mg total) under the tongue every 5 (five) minutes as needed for chest pain., Disp: 90 tablet, Rfl: 3 .  rosuvastatin (CRESTOR) 10 MG tablet, Take 10 mg by mouth daily., Disp: , Rfl:  .  tadalafil (CIALIS) 10 MG tablet, Take 10 mg by mouth as needed for erectile dysfunction., Disp: , Rfl:  .  tamsulosin (FLOMAX) 0.4 MG CAPS, Take 0.4 mg by mouth daily., Disp: , Rfl:  .  ticagrelor (BRILINTA) 90 MG TABS tablet, Take 1 tablet (90 mg total) by mouth 2 (two) times daily., Disp: 180 tablet, Rfl: 3  Past Medical History: Past Medical History:  Diagnosis Date  . Coronary artery disease   . Decreased libido   . Diabetes mellitus without complication ([place])   . Diverticulosis    . ED (erectile dysfunction)   . Elevated PSA    recently biopsied, seeing Dr. Jasmine December at [facility]  . Hyperplastic colon polyp   . Hypogonadism male   . Kidney stones 2007  . Personal history of urinary calculi   . RUQ abdominal pain     Tobacco Use: Social History   Tobacco Use  Smoking Status Former Smoker  . Packs/day: 0.25  . Years: 20.00  . Pack years: 5.00  . Types: Cigarettes  . Quit date: 2022  . Years since quitting: 0.3  Smokeless Tobacco Never Used  Tobacco Comment   Quit after Heart Attack    Labs: Recent Review Flowsheet Data    Labs for ITP Cardiac and Pulmonary Rehab Latest Ref Rng & Units 02/27/2013   Cholestrol 0 - 200 mg/dL 180   LDLCALC 0 - 99 mg/dL 101(H)   HDL >39 mg/dL 63   Trlycerides <150 mg/dL 78      Capillary Blood Glucose: Lab Results  Component Value Date   GLUCAP 142 (H) 11/18/2020   GLUCAP 86 11/16/2020   GLUCAP 160 (H) 11/16/2020     Exercise Target Goals: Exercise Program Goal: Individual exercise prescription set using results from initial 6 min walk test and THRR while considering  patient's activity barriers and safety.   Exercise Prescription Goal: Starting with aerobic activity 30 plus minutes a day, 3 days per week for initial  exercise prescription. Provide home exercise prescription and guidelines that participant acknowledges understanding prior to discharge.  Activity Barriers & Risk Stratification:  Activity Barriers & Cardiac Risk Stratification - 11/10/20 1549      Activity Barriers & Cardiac Risk Stratification   Activity Barriers None    Cardiac Risk Stratification Moderate           6 Minute Walk:  6 Minute Walk    Row Name 11/10/20 1508         6 Minute Walk   Phase Initial     Distance 1436 feet     Walk Time 6 minutes     # of Rest Breaks 0     MPH 2.72     METS 31.3     RPE 8     Perceived Dyspnea  0     VO2 Peak 10.96     Symptoms No     Resting HR 65 bpm     Resting BP 108/70      Resting Oxygen Saturation  99 %     Exercise Oxygen Saturation  during 6 min walk 99 %     Max Ex. HR 74 bpm     Max Ex. BP 134/62     2 Minute Post BP 118/64            Oxygen Initial Assessment:   Oxygen Re-Evaluation:   Oxygen Discharge (Final Oxygen Re-Evaluation):   Initial Exercise Prescription:  Initial Exercise Prescription - 11/10/20 1500      Date of Initial Exercise RX and Referring Provider   Date 11/10/20    Referring Provider Vernell Leep, MD    Expected Discharge Date 01/08/21      Treadmill   MPH 2.8    Grade 1    Minutes 15    METs 3.53      NuStep   Level 3    SPM 75    Minutes 15    METs 2.4      Prescription Details   Frequency (times per week) 3    Duration Progress to 30 minutes of continuous aerobic without signs/symptoms of physical distress      Intensity   THRR 40-80% of Max Heartrate 61-122    Ratings of Perceived Exertion 11-13    Perceived Dyspnea 0-4      Progression   Progression Continue progressive overload as per policy without signs/symptoms or physical distress.      Resistance Training   Training Prescription Yes    Weight 5 lbs    Reps 10-15           Perform Capillary Blood Glucose checks as needed.  Exercise Prescription Changes:  Exercise Prescription Changes    Row Name 11/16/20 1430 11/23/20 1500 11/30/20 1350 12/16/20 1400       Response to Exercise   Blood Pressure (Admit) 106/56 118/62 110/60 122/78    Blood Pressure (Exercise) 134/66 118/72 118/62 142/72    Blood Pressure (Exit) 120/60 102/68 104/56 122/70    Heart Rate (Admit) 75 bpm 72 bpm 70 bpm 61 bpm    Heart Rate (Exercise) 82 bpm 84 bpm 82 bpm 100 bpm    Heart Rate (Exit) 75 bpm 72 bpm 68 bpm 66 bpm    Rating of Perceived Exertion (Exercise) 11 12 12 13     Symptoms None None None None    Comments Pt's first day of exercise in the CRP2 program. Reviewed Home exercsie Rx Reviewed METS Reviewed  METs and Goals    Duration Progress to 30  minutes of  aerobic without signs/symptoms of physical distress Continue with 30 min of aerobic exercise without signs/symptoms of physical distress. Continue with 30 min of aerobic exercise without signs/symptoms of physical distress. Continue with 30 min of aerobic exercise without signs/symptoms of physical distress.    Intensity THRR unchanged THRR unchanged THRR unchanged THRR unchanged         Progression   Progression Continue to progress workloads to maintain intensity without signs/symptoms of physical distress. Continue to progress workloads to maintain intensity without signs/symptoms of physical distress. Continue to progress workloads to maintain intensity without signs/symptoms of physical distress. Continue to progress workloads to maintain intensity without signs/symptoms of physical distress.    Average METs 2.9 2.9 3.2 3.9         Resistance Training   Training Prescription Yes Yes Yes No    Weight 5 lbs 5 lbs 5 lbs No weights on Wednesdays    Reps 10-15 10-15 10-15 --    Time 10 Minutes 10 Minutes 10 Minutes --         Interval Training   Interval Training No No No No         Treadmill   MPH 2.6 2.6 2.6 3    Grade 1 1 2 2     Minutes 15 15 15 15     METs 3.35 3.35 3.71 4.12         NuStep   Level 3 4 4 5     SPM 75 75 85 95    Minutes 15 15 15 15     METs 2.4 2.3 2.7 3.7         Home Exercise Plan   Plans to continue exercise at -- Home (comment) Home (comment) Home (comment)    Frequency -- Add 2 additional days to program exercise sessions. Add 2 additional days to program exercise sessions. Add 2 additional days to program exercise sessions.    Initial Home Exercises Provided -- 11/23/20 11/23/20 11/23/20           Exercise Comments:  Exercise Comments    Row Name 11/16/20 1630 11/23/20 1558 11/30/20 1350 12/01/20 0750 12/16/20 1443   Exercise Comments Pt's first day in hte CRP2 program. Pt tolerated session well with no complaints and is off to a good  start. Reviewed home exercise Rx with patient today. Pt verbaized understnading of the home exercise Rx and was provided a copy. Reviewed METs with patient. Pt is making good progess in the CRP2 program. -- Reviewed METS and goals. Making good progress toeard goals. Will increase workload on treadmill next session.          Exercise Goals and Review:  Exercise Goals    Row Name 11/10/20 1553             Exercise Goals   Increase Physical Activity Yes       Intervention Provide advice, education, support and counseling about physical activity/exercise needs.;Develop an individualized exercise prescription for aerobic and resistive training based on initial evaluation findings, risk stratification, comorbidities and participant's personal goals.       Expected Outcomes Short Term: Attend rehab on a regular basis to increase amount of physical activity.;Long Term: Add in home exercise to make exercise part of routine and to increase amount of physical activity.;Long Term: Exercising regularly at least 3-5 days a week.       Increase Strength and Stamina Yes  Intervention Provide advice, education, support and counseling about physical activity/exercise needs.;Develop an individualized exercise prescription for aerobic and resistive training based on initial evaluation findings, risk stratification, comorbidities and participant's personal goals.       Expected Outcomes Short Term: Increase workloads from initial exercise prescription for resistance, speed, and METs.;Long Term: Improve cardiorespiratory fitness, muscular endurance and strength as measured by increased METs and functional capacity (6MWT);Short Term: Perform resistance training exercises routinely during rehab and add in resistance training at home       Able to understand and use rate of perceived exertion (RPE) scale Yes       Intervention Provide education and explanation on how to use RPE scale       Expected Outcomes Long  Term:  Able to use RPE to guide intensity level when exercising independently;Short Term: Able to use RPE daily in rehab to express subjective intensity level       Knowledge and understanding of Target Heart Rate Range (THRR) Yes       Intervention Provide education and explanation of THRR including how the numbers were predicted and where they are located for reference       Expected Outcomes Short Term: Able to state/look up THRR;Long Term: Able to use THRR to govern intensity when exercising independently;Short Term: Able to use daily as guideline for intensity in rehab       Understanding of Exercise Prescription Yes       Intervention Provide education, explanation, and written materials on patient's individual exercise prescription       Expected Outcomes Short Term: Able to explain program exercise prescription;Long Term: Able to explain home exercise prescription to exercise independently              Exercise Goals Re-Evaluation :  Exercise Goals Re-Evaluation    Row Name 11/16/20 1430 11/23/20 1557 12/16/20 1441         Exercise Goal Re-Evaluation   Exercise Goals Review Increase Physical Activity;Increase Strength and Stamina;Able to understand and use rate of perceived exertion (RPE) scale;Knowledge and understanding of Target Heart Rate Range (THRR);Understanding of Exercise Prescription Increase Physical Activity;Increase Strength and Stamina;Able to understand and use rate of perceived exertion (RPE) scale;Knowledge and understanding of Target Heart Rate Range (THRR);Able to check pulse independently;Understanding of Exercise Prescription Increase Physical Activity;Increase Strength and Stamina;Able to understand and use rate of perceived exertion (RPE) scale;Knowledge and understanding of Target Heart Rate Range (THRR);Able to check pulse independently;Understanding of Exercise Prescription     Comments Pt's first day in the CRP2 program. Pt understands the THRR, RPE sclae and  Exercise Rx. Reviewed home exericse Rx with patient today. Pt is already walking 2x/week for 30-45 minutes in addtion to playing golf 2x/week. Pt is active everday. Reviewed METs and Goals. Pt is making excleent progress in the CRP2 program. Noted increased strength and stamina. Pt walks and plays golf in addtion to the program,     Expected Outcomes Will continue to monitor patient and progress as tolerated. Pt will continue to exercise at home on his own. WIll continue to monitor patient and progress as tolerated.             Discharge Exercise Prescription (Final Exercise Prescription Changes):  Exercise Prescription Changes - 12/16/20 1400      Response to Exercise   Blood Pressure (Admit) 122/78    Blood Pressure (Exercise) 142/72    Blood Pressure (Exit) 122/70    Heart Rate (Admit) 61 bpm  Heart Rate (Exercise) 100 bpm    Heart Rate (Exit) 66 bpm    Rating of Perceived Exertion (Exercise) 13    Symptoms None    Comments Reviewed METs and Goals    Duration Continue with 30 min of aerobic exercise without signs/symptoms of physical distress.    Intensity THRR unchanged      Progression   Progression Continue to progress workloads to maintain intensity without signs/symptoms of physical distress.    Average METs 3.9      Resistance Training   Training Prescription No    Weight No weights on Wednesdays      Interval Training   Interval Training No      Treadmill   MPH 3    Grade 2    Minutes 15    METs 4.12      NuStep   Level 5    SPM 95    Minutes 15    METs 3.7      Home Exercise Plan   Plans to continue exercise at Home (comment)    Frequency Add 2 additional days to program exercise sessions.    Initial Home Exercises Provided 11/23/20           Nutrition:  Target Goals: Understanding of nutrition guidelines, daily intake of sodium 1500mg , cholesterol 200mg , calories 30% from fat and 7% or less from saturated fats, daily to have 5 or more servings  of fruits and vegetables.  Biometrics:  Pre Biometrics - 11/10/20 1400      Pre Biometrics   Waist Circumference 41 inches    Hip Circumference 41.5 inches    Waist to Hip Ratio 0.99 %    Triceps Skinfold 16 mm    % Body Fat 27.6 %    Grip Strength 48 kg    Flexibility 9.25 in    Single Leg Stand 30 seconds            Nutrition Therapy Plan and Nutrition Goals:  Nutrition Therapy & Goals - 11/26/20 0931      Nutrition Therapy   Diet TLC; carb modified    Drug/Food Interactions Statins/Certain Fruits      Personal Nutrition Goals   Nutrition Goal Pt to build a healthy plate including vegetables, fruits, whole grains, and low-fat dairy products in a heart healthy meal plan.    Personal Goal #2 Pt to eat out <2 times per week    Personal Goal #3 Pt to continue to reduce sodium to <2300 mg/day      Intervention Plan   Intervention Prescribe, educate and counsel regarding individualized specific dietary modifications aiming towards targeted core components such as weight, hypertension, lipid management, diabetes, heart failure and other comorbidities.;Nutrition handout(s) given to patient.    Expected Outcomes Long Term Goal: Adherence to prescribed nutrition plan.;Short Term Goal: Understand basic principles of dietary content, such as calories, fat, sodium, cholesterol and nutrients.           Nutrition Assessments:  MEDIFICTS Score Key:  ?70 Need to make dietary changes   40-70 Heart Healthy Diet  ? 40 Therapeutic Level Cholesterol Diet  Flowsheet Row CARDIAC REHAB PHASE II EXERCISE from 11/25/2020 in [place]  Picture Your Plate Total Score on Admission 56     Picture Your Plate Scores:  <76 Unhealthy dietary pattern with much room for improvement.  41-50 Dietary pattern unlikely to meet recommendations for good health and room for improvement.  51-60 More healthful dietary  pattern, with some room for improvement.   >60  Healthy dietary pattern, although there may be some specific behaviors that could be improved.    Nutrition Goals Re-Evaluation:  Nutrition Goals Re-Evaluation    [place] Name 11/26/20 0932 12/28/20 1516           Goals   Current Weight 185 lb (83.9 kg) 188 lb 15 oz (85.7 kg)      Nutrition Goal Pt to build a healthy plate including vegetables, fruits, whole grains, and low-fat dairy products in a heart healthy meal plan. Pt to build a healthy plate including vegetables, fruits, whole grains, and low-fat dairy products in a heart healthy meal plan.             Personal Goal #2 Re-Evaluation   Personal Goal #2 Pt to eat out <2 times per week Pt to eat out <2 times per week             Personal Goal #3 Re-Evaluation   Personal Goal #3 Pt to continue to reduce sodium to <2300 mg/day Pt to continue to reduce sodium to <2300 mg/day             Nutrition Goals Discharge (Final Nutrition Goals Re-Evaluation):  Nutrition Goals Re-Evaluation - 12/28/20 1516      Goals   Current Weight 188 lb 15 oz (85.7 kg)    Nutrition Goal Pt to build a healthy plate including vegetables, fruits, whole grains, and low-fat dairy products in a heart healthy meal plan.      Personal Goal #2 Re-Evaluation   Personal Goal #2 Pt to eat out <2 times per week      Personal Goal #3 Re-Evaluation   Personal Goal #3 Pt to continue to reduce sodium to <2300 mg/day           Psychosocial: Target Goals: Acknowledge presence or absence of significant depression and/or stress, maximize coping skills, provide positive support system. Participant is able to verbalize types and ability to use techniques and skills needed for reducing stress and depression.  Initial Review & Psychosocial Screening:  Initial Psych Review & Screening - 11/10/20 1604      Initial Review   Current issues with None Identified      Family Dynamics   Good Support System? Yes   Zeki has his wfie and children for support      Barriers   Psychosocial barriers to participate in program There are no identifiable barriers or psychosocial needs.      Screening Interventions   Interventions Encouraged to exercise           Quality of Life Scores:  Quality of Life - 11/10/20 1544      Quality of Life   Select Quality of Life      Quality of Life Scores   Health/Function Pre 28.83 %    Socioeconomic Pre 27.5 %    Psych/Spiritual Pre 28.21 %    Family Pre 27.6 %    GLOBAL Pre 28.25 %          Scores of 19 and below usually indicate a poorer quality of life in these areas.  A difference of  2-3 points is a clinically meaningful difference.  A difference of 2-3 points in the total score of the Quality of Life Index has been associated with significant improvement in overall quality of life, self-image, physical symptoms, and general health in studies assessing change in quality of life.  PHQ-9: Recent Review  Flowsheet Data    Depression screen [facility] 2/9 11/10/2020 07/16/2018 02/27/2013   Decreased Interest 0 0 1   Down, Depressed, Hopeless 0 0 1   PHQ - 2 Score 0 0 2     Interpretation of Total Score  Total Score Depression Severity:  1-4 = Minimal depression, 5-9 = Mild depression, 10-14 = Moderate depression, 15-19 = Moderately severe depression, 20-27 = Severe depression   Psychosocial Evaluation and Intervention:   Psychosocial Re-Evaluation:  Psychosocial Re-Evaluation    [place] Name 12/01/20 1430 12/29/20 1520           Psychosocial Re-Evaluation   Current issues with None Identified None Identified      Interventions Encouraged to attend Cardiac Rehabilitation for the exercise Encouraged to attend Cardiac Rehabilitation for the exercise      Continue Psychosocial Services  No Follow up required No Follow up required             Psychosocial Discharge (Final Psychosocial Re-Evaluation):  Psychosocial Re-Evaluation - 12/29/20 1520      Psychosocial Re-Evaluation   Current issues with None  Identified    Interventions Encouraged to attend Cardiac Rehabilitation for the exercise    Continue Psychosocial Services  No Follow up required           Vocational Rehabilitation: Provide vocational rehab assistance to qualifying candidates.   Vocational Rehab Evaluation & Intervention:  Vocational Rehab - 11/10/20 1605      Initial Vocational Rehab Evaluation & Intervention   Assessment shows need for Vocational Rehabilitation No   Alexy is retired and does not need vocatinal rehab at this time          Education: Education Goals: Education classes will be provided on a weekly basis, covering required topics. Participant will state understanding/return demonstration of topics presented.  Learning Barriers/Preferences:  Learning Barriers/Preferences - 11/10/20 1545      Learning Barriers/Preferences   Learning Barriers Sight;Hearing   Wears glasses, voices might need hearing aid   Learning Preferences Written Material;Skilled Demonstration;Individual Instruction;Group Instruction           Education Topics: Hypertension, Hypertension Reduction -Define heart disease and high blood pressure. Discus how high blood pressure affects the body and ways to reduce high blood pressure.   Exercise and Your Heart -Discuss why it is important to exercise, the FITT principles of exercise, normal and abnormal responses to exercise, and how to exercise safely.   Angina -Discuss definition of angina, causes of angina, treatment of angina, and how to decrease risk of having angina.   Cardiac Medications -Review what the following cardiac medications are used for, how they affect the body, and side effects that may occur when taking the medications.  Medications include Aspirin, Beta blockers, calcium channel blockers, ACE Inhibitors, angiotensin receptor blockers, diuretics, digoxin, and antihyperlipidemics.   Congestive Heart Failure -Discuss the definition of CHF, how to  live with CHF, the signs and symptoms of CHF, and how keep track of weight and sodium intake.   Heart Disease and Intimacy -Discus the effect sexual activity has on the heart, how changes occur during intimacy as we age, and safety during sexual activity.   Smoking Cessation / COPD -Discuss different methods to quit smoking, the health benefits of quitting smoking, and the definition of COPD.   Nutrition I: Fats -Discuss the types of cholesterol, what cholesterol does to the heart, and how cholesterol levels can be controlled.   Nutrition II: Labels -Discuss the different components of food  labels and how to read food label   Heart Parts/Heart Disease and PAD -Discuss the anatomy of the heart, the pathway of blood circulation through the heart, and these are affected by heart disease.   Stress I: Signs and Symptoms -Discuss the causes of stress, how stress may lead to anxiety and depression, and ways to limit stress.   Stress II: Relaxation -Discuss different types of relaxation techniques to limit stress.   Warning Signs of Stroke / TIA -Discuss definition of a stroke, what the signs and symptoms are of a stroke, and how to identify when someone is having stroke.   Knowledge Questionnaire Score:  Knowledge Questionnaire Score - 11/10/20 1546      Knowledge Questionnaire Score   Pre Score 25/28           Core Components/Risk Factors/Patient Goals at Admission:  Personal Goals and Risk Factors at Admission - 11/10/20 1546      Core Components/Risk Factors/Patient Goals on Admission    Weight Management Weight Maintenance    Tobacco Cessation Yes    Number of packs per day 1-2 cigarettes per week    Intervention Assist the participant in steps to quit. Provide individualized education and counseling about committing to Tobacco Cessation, relapse prevention, and pharmacological support that can be provided by physician.;Advice worker, assist with  locating and accessing local/national Quit Smoking programs, and support quit date choice.   Patient given phone number to 1-800-quit-now   Expected Outcomes Short Term: Will demonstrate readiness to quit, by selecting a quit date.;Short Term: Will quit all tobacco product use, adhering to prevention of relapse plan.;Long Term: Complete abstinence from all tobacco products for at least 12 months from quit date.    Diabetes Yes    Intervention Provide education about signs/symptoms and action to take for hypo/hyperglycemia.;Provide education about proper nutrition, including hydration, and aerobic/resistive exercise prescription along with prescribed medications to achieve blood glucose in normal ranges: Fasting glucose 65-99 mg/dL    Expected Outcomes Short Term: Participant verbalizes understanding of the signs/symptoms and immediate care of hyper/hypoglycemia, proper foot care and importance of medication, aerobic/resistive exercise and nutrition plan for blood glucose control.;Long Term: Attainment of HbA1C < 7%.    Hypertension Yes    Intervention Provide education on lifestyle modifcations including regular physical activity/exercise, weight management, moderate sodium restriction and increased consumption of fresh fruit, vegetables, and low fat dairy, alcohol moderation, and smoking cessation.;Monitor prescription use compliance.    Expected Outcomes Short Term: Continued assessment and intervention until BP is < 140/42mm HG in hypertensive participants. < 130/67mm HG in hypertensive participants with diabetes, heart failure or chronic kidney disease.;Long Term: Maintenance of blood pressure at goal levels.    Lipids Yes    Intervention Provide education and support for participant on nutrition & aerobic/resistive exercise along with prescribed medications to achieve LDL 70mg , HDL >40mg .    Expected Outcomes Short Term: Participant states understanding of desired cholesterol values and is compliant  with medications prescribed. Participant is following exercise prescription and nutrition guidelines.;Long Term: Cholesterol controlled with medications as prescribed, with individualized exercise RX and with personalized nutrition plan. Value goals: LDL < 70mg , HDL > 40 mg.           Core Components/Risk Factors/Patient Goals Review:   Goals and Risk Factor Review    Row Name 12/01/20 1432 12/29/20 1520           Core Components/Risk Factors/Patient Goals Review   Personal Goals Review Weight Management/Obesity;Hypertension;Tobacco Cessation;Lipids;Diabetes  Weight Management/Obesity;Hypertension;Tobacco Cessation;Lipids;Diabetes      Review Baldomero is doing well with exercise. Giovannie vital signs have been stable. Will continue to encourage smoking cessation. Diabetes is diet controlled Marquese continues to do  well with exercise. Jaydenn vital signs have been stable. Will continue to encourage smoking cessation as Juel continues to smoke 5 cigarettes a day.. Diabetes is diet controlled      Expected Outcomes Xylan will continue to participate in cardiac rehab for exercise, nutrition and lifestyle modifications Shadrach will continue to participate in cardiac rehab for exercise, nutrition and lifestyle modifications             Core Components/Risk Factors/Patient Goals at Discharge (Final Review):   Goals and Risk Factor Review - 12/29/20 1520      Core Components/Risk Factors/Patient Goals Review   Personal Goals Review Weight Management/Obesity;Hypertension;Tobacco Cessation;Lipids;Diabetes    Review Lekendrick continues to do  well with exercise. Verl vital signs have been stable. Will continue to encourage smoking cessation as Aayansh continues to smoke 5 cigarettes a day.. Diabetes is diet controlled    Expected Outcomes Ithan will continue to participate in cardiac rehab for exercise, nutrition and lifestyle modifications           ITP Comments:  ITP Comments    Row  Name 11/10/20 1600 12/01/20 1428 12/29/20 1519       ITP Comments Dr Fransico Him MD, Medical Director 30 Day ITP Review. Vicente has good attendance and participation in phase 2 cardiac rehab. 30 Day ITP Review. Bryer continues to have  good attendance and participation in phase 2 cardiac rehab.            Comments: See ITP comments.Barnet Pall, RN,BSN 12/29/2020 3:22 PM

## 2020-12-30 ENCOUNTER — Encounter (HOSPITAL_COMMUNITY)
Admission: RE | Admit: 2020-12-30 | Discharge: 2020-12-30 | Disposition: A | Discharge status: Home / Self Care | Payer: Medicare Other | Source: Ambulatory Visit | Attending: Cardiology | Admitting: Cardiology

## 2020-12-30 ENCOUNTER — Other Ambulatory Visit: Payer: Self-pay

## 2020-12-30 DIAGNOSIS — I208 Other forms of angina pectoris: Secondary | ICD-10-CM | POA: Diagnosis not present

## 2021-01-01 ENCOUNTER — Encounter (HOSPITAL_COMMUNITY)
Admission: RE | Admit: 2021-01-01 | Discharge: 2021-01-01 | Disposition: A | Discharge status: Home / Self Care | Payer: Medicare Other | Source: Ambulatory Visit | Attending: Cardiology | Admitting: Cardiology

## 2021-01-01 ENCOUNTER — Other Ambulatory Visit: Payer: Self-pay

## 2021-01-01 DIAGNOSIS — I208 Other forms of angina pectoris: Secondary | ICD-10-CM

## 2021-01-06 ENCOUNTER — Encounter (HOSPITAL_COMMUNITY)
Admission: RE | Admit: 2021-01-06 | Discharge: 2021-01-06 | Disposition: A | Discharge status: Home / Self Care | Payer: Medicare Other | Source: Ambulatory Visit | Attending: Cardiology | Admitting: Cardiology

## 2021-01-06 ENCOUNTER — Other Ambulatory Visit: Payer: Self-pay

## 2021-01-06 VITALS — Ht 70.25 in | Wt 186.5 lb

## 2021-01-06 DIAGNOSIS — I208 Other forms of angina pectoris: Secondary | ICD-10-CM | POA: Insufficient documentation

## 2021-01-08 ENCOUNTER — Encounter (HOSPITAL_COMMUNITY)
Admission: RE | Admit: 2021-01-08 | Discharge: 2021-01-08 | Disposition: A | Discharge status: Home / Self Care | Payer: Medicare Other | Source: Ambulatory Visit | Attending: Cardiology | Admitting: Cardiology

## 2021-01-08 ENCOUNTER — Other Ambulatory Visit: Payer: Self-pay

## 2021-01-08 DIAGNOSIS — I208 Other forms of angina pectoris: Secondary | ICD-10-CM

## 2021-01-08 NOTE — Progress Notes (Addendum)
Discharge Progress Report  Patient Details  Name: Adrian Baird MRN: [id number] Date of Birth: 05/05/1951 Referring Provider:   Flowsheet Row CARDIAC REHAB PHASE II ORIENTATION from 11/10/2020 in [facility]  Referring Provider Vernell Leep, MD        Number of Visits: 18  Reason for Discharge:  Patient reached a stable level of exercise. Patient independent in their exercise. Patient has met program and personal goals.  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 Heart Attack    Diagnosis:  Stable angina ([place]) S/P Cath 08/06/20 Medical Treatment  ADL [facility]:   Initial Exercise Prescription:  Initial Exercise Prescription - 11/10/20 1500       Date of Initial Exercise RX and Referring Provider   Date 11/10/20    Referring Provider Vernell Leep, MD    Expected Discharge Date 01/08/21      Treadmill   MPH 2.8    Grade 1    Minutes 15    METs 3.53      NuStep   Level 3    SPM 75    Minutes 15    METs 2.4      Prescription Details   Frequency (times per week) 3    Duration Progress to 30 minutes of continuous aerobic without signs/symptoms of physical distress      Intensity   THRR 40-80% of Max Heartrate 61-122    Ratings of Perceived Exertion 11-13    Perceived Dyspnea 0-4      Progression   Progression Continue progressive overload as per policy without signs/symptoms or physical distress.      Resistance Training   Training Prescription Yes    Weight 5 lbs    Reps 10-15             Discharge Exercise Prescription (Final Exercise Prescription Changes):  Exercise Prescription Changes - 01/08/21 1400       Response to Exercise   Blood Pressure (Admit) 118/60    Blood Pressure (Exercise) 138/82     Blood Pressure (Exit) 100/64    Heart Rate (Admit) 61 bpm    Heart Rate (Exercise) 101 bpm    Heart Rate (Exit) 71 bpm    Rating of Perceived Exertion (Exercise) 12.5    Symptoms None    Comments Pt graduated the CRP2 program    Duration Continue with 30 min of aerobic exercise without signs/symptoms of physical distress.    Intensity THRR unchanged      Progression   Progression Continue to progress workloads to maintain intensity without signs/symptoms of physical distress.    Average METs 4.5      Resistance Training   Training Prescription Yes    Weight 8 lbs    Reps 10-15    Time 10 Minutes      Interval Training   Interval Training No      Treadmill   MPH 3    Grade 3    Minutes 15    METs 4.54      NuStep   Level 6    SPM 85    Minutes 15    METs 4.5      Home Exercise Plan   Plans to  continue exercise at Home (comment)    Frequency Add 2 additional days to program exercise sessions.    Initial Home Exercises Provided 11/23/20             Functional Capacity:  6 Minute Walk     Row Name 11/10/20 1508 01/06/21 1316       6 Minute Walk   Phase Initial Discharge    Distance 1436 feet 1705 feet    Distance % Change -- 18.73 %    Distance Feet Change -- 269 ft    Walk Time 6 minutes 6 minutes    # of Rest Breaks 0 0    MPH 2.72 3.23    METS 31.3 3.59    RPE 8 11    Perceived Dyspnea  0 0    VO2 Peak 10.96 12.51    Symptoms No No    Resting HR 65 bpm 83 bpm    Resting BP 108/70 124/62    Resting Oxygen Saturation  99 % 96 %    Exercise Oxygen Saturation  during 6 min walk 99 % 97 %    Max Ex. HR 74 bpm 81 bpm    Max Ex. BP 134/62 134/64    2 Minute Post BP 118/64 --             Psychological, QOL, Others - Outcomes: PHQ 2/9: Depression screen [facility] 2/9 01/08/2021 11/10/2020 07/16/2018 02/27/2013  Decreased Interest 0 0 0 1  Down, Depressed, Hopeless 0 0 0 1  PHQ - 2 Score 0 0 0 2    Quality of Life:  Quality of Life - 01/06/21 1421        Quality of Life   Select Quality of Life      Quality of Life Scores   Health/Function Post 29.03 %    Socioeconomic Post 28.21 %    Psych/Spiritual Post 28.75 %    Family Post 27.6 %    GLOBAL Post 28.6 %             Personal Goals: Goals established at orientation with interventions provided to work toward goal.  Personal Goals and Risk Factors at Admission - 11/10/20 1546       Core Components/Risk Factors/Patient Goals on Admission    Weight Management Weight Maintenance    Tobacco Cessation Yes    Number of packs per day 1-2 cigarettes per week    Intervention Assist the participant in steps to quit. Provide individualized education and counseling about committing to Tobacco Cessation, relapse prevention, and pharmacological support that can be provided by physician.;Education officer, environmental, assist with locating and accessing local/national Quit Smoking programs, and support quit date choice.   Patient given phone number to 1-800-quit-now   Expected Outcomes Short Term: Will demonstrate readiness to quit, by selecting a quit date.;Short Term: Will quit all tobacco product use, adhering to prevention of relapse plan.;Long Term: Complete abstinence from all tobacco products for at least 12 months from quit date.    Diabetes Yes    Intervention Provide education about signs/symptoms and action to take for hypo/hyperglycemia.;Provide education about proper nutrition, including hydration, and aerobic/resistive exercise prescription along with prescribed medications to achieve blood glucose in normal ranges: Fasting glucose 65-99 mg/dL    Expected Outcomes Short Term: Participant verbalizes understanding of the signs/symptoms and immediate care of hyper/hypoglycemia, proper foot care and importance of medication, aerobic/resistive exercise and nutrition plan for blood glucose control.;Long Term: Attainment of HbA1C < 7%.  Hypertension Yes    Intervention Provide  education on lifestyle modifcations including regular physical activity/exercise, weight management, moderate sodium restriction and increased consumption of fresh fruit, vegetables, and low fat dairy, alcohol moderation, and smoking cessation.;Monitor prescription use compliance.    Expected Outcomes Short Term: Continued assessment and intervention until BP is < 140/33mm HG in hypertensive participants. < 130/61mm HG in hypertensive participants with diabetes, heart failure or chronic kidney disease.;Long Term: Maintenance of blood pressure at goal levels.    Lipids Yes    Intervention Provide education and support for participant on nutrition & aerobic/resistive exercise along with prescribed medications to achieve LDL '70mg'$ , HDL >$Remo'40mg'KhXVb$ .    Expected Outcomes Short Term: Participant states understanding of desired cholesterol values and is compliant with medications prescribed. Participant is following exercise prescription and nutrition guidelines.;Long Term: Cholesterol controlled with medications as prescribed, with individualized exercise RX and with personalized nutrition plan. Value goals: LDL < $Rem'70mg'xNGU$ , HDL > 40 mg.              Personal Goals Discharge:  Goals and Risk Factor Review     Row Name 12/01/20 1432 12/29/20 1520 01/08/21 1512         Core Components/Risk Factors/Patient Goals Review   Personal Goals Review Weight Management/Obesity;Hypertension;Tobacco Cessation;Lipids;Diabetes Weight Management/Obesity;Hypertension;Tobacco Cessation;Lipids;Diabetes Weight Management/Obesity;Hypertension;Tobacco Cessation;Lipids;Diabetes     Review Jaylun is doing well with exercise. Jett vital signs have been stable. Will continue to encourage smoking cessation. Diabetes is diet controlled Jayden continues to do  well with exercise. Demetrio vital signs have been stable. Will continue to encourage smoking cessation as Keifer continues to smoke 5 cigarettes a day.. Diabetes is diet  controlled Kevron did well with exercise. Butler vital signs were  stable. Will continue to encourage smoking cessation as Vanessa continues to smoke 1-2  cigarettes a day.. Diabetes is diet controlled. Daegan completed cardiac rehab on 01/08/21.     Expected Outcomes Darrius will continue to participate in cardiac rehab for exercise, nutrition and lifestyle modifications Mckinnon will continue to participate in cardiac rehab for exercise, nutrition and lifestyle modifications Joeseph will continue to  exercise, follow nutrition and lifestyle modifications upon completion of phase 2 cardiac rehab.              Exercise Goals and Review:  Exercise Goals     Row Name 11/10/20 1553             Exercise Goals   Increase Physical Activity Yes       Intervention Provide advice, education, support and counseling about physical activity/exercise needs.;Develop an individualized exercise prescription for aerobic and resistive training based on initial evaluation findings, risk stratification, comorbidities and participant's personal goals.       Expected Outcomes Short Term: Attend rehab on a regular basis to increase amount of physical activity.;Long Term: Add in home exercise to make exercise part of routine and to increase amount of physical activity.;Long Term: Exercising regularly at least 3-5 days a week.       Increase Strength and Stamina Yes       Intervention Provide advice, education, support and counseling about physical activity/exercise needs.;Develop an individualized exercise prescription for aerobic and resistive training based on initial evaluation findings, risk stratification, comorbidities and participant's personal goals.       Expected Outcomes Short Term: Increase workloads from initial exercise prescription for resistance, speed, and METs.;Long Term: Improve cardiorespiratory fitness, muscular endurance and strength as measured by increased METs and functional capacity  (6MWT);Short Term:  Perform resistance training exercises routinely during rehab and add in resistance training at home       Able to understand and use rate of perceived exertion (RPE) scale Yes       Intervention Provide education and explanation on how to use RPE scale       Expected Outcomes Long Term:  Able to use RPE to guide intensity level when exercising independently;Short Term: Able to use RPE daily in rehab to express subjective intensity level       Knowledge and understanding of Target Heart Rate Range (THRR) Yes       Intervention Provide education and explanation of THRR including how the numbers were predicted and where they are located for reference       Expected Outcomes Short Term: Able to state/look up THRR;Long Term: Able to use THRR to govern intensity when exercising independently;Short Term: Able to use daily as guideline for intensity in rehab       Understanding of Exercise Prescription Yes       Intervention Provide education, explanation, and written materials on patient's individual exercise prescription       Expected Outcomes Short Term: Able to explain program exercise prescription;Long Term: Able to explain home exercise prescription to exercise independently                Exercise Goals Re-Evaluation:  Exercise Goals Re-Evaluation     Row Name 11/16/20 1430 11/23/20 1557 12/16/20 1441 01/08/21 1430       Exercise Goal Re-Evaluation   Exercise Goals Review Increase Physical Activity;Increase Strength and Stamina;Able to understand and use rate of perceived exertion (RPE) scale;Knowledge and understanding of Target Heart Rate Range (THRR);Understanding of Exercise Prescription Increase Physical Activity;Increase Strength and Stamina;Able to understand and use rate of perceived exertion (RPE) scale;Knowledge and understanding of Target Heart Rate Range (THRR);Able to check pulse independently;Understanding of Exercise Prescription Increase Physical  Activity;Increase Strength and Stamina;Able to understand and use rate of perceived exertion (RPE) scale;Knowledge and understanding of Target Heart Rate Range (THRR);Able to check pulse independently;Understanding of Exercise Prescription Increase Physical Activity;Increase Strength and Stamina;Able to understand and use rate of perceived exertion (RPE) scale;Knowledge and understanding of Target Heart Rate Range (THRR);Able to check pulse independently;Understanding of Exercise Prescription    Comments Pt's first day in the CRP2 program. Pt understands the THRR, RPE sclae and Exercise Rx. Reviewed home exericse Rx with patient today. Pt is already walking 2x/week for 30-45 minutes in addtion to playing golf 2x/week. Pt is active everday. Reviewed METs and Goals. Pt is making excleent progress in the CRP2 program. Noted increased strength and stamina. Pt walks and plays golf in addtion to the program, Pt graduated the [place] program today with an average MET level of 4.5. Pt will continue to lead an active lifestyle and exercise on his own. He progressed very well in the program.    Expected Outcomes Will continue to monitor patient and progress as tolerated. Pt will continue to exercise at home on his own. WIll continue to monitor patient and progress as tolerated. Pt will continue to exercise and lead an active lifestyle             Nutrition & Weight - Outcomes:  Pre Biometrics - 11/10/20 1400       Pre Biometrics   Waist Circumference 41 inches    Hip Circumference 41.5 inches    Waist to Hip Ratio 0.99 %    Triceps Skinfold 16 mm    %  Body Fat 27.6 %    Grip Strength 48 kg    Flexibility 9.25 in    Single Leg Stand 30 seconds             Post Biometrics - 01/06/21 1420        Post  Biometrics   Height 5' 10.25" (1.784 m)    Weight 84.6 kg    Waist Circumference 39.75 inches    Hip Circumference 41 inches    Waist to Hip Ratio 0.97 %    BMI (Calculated) 26.58    Triceps  Skinfold 13 mm    % Body Fat 26.2 %    Grip Strength 50 kg    Flexibility 11.5 in    Single Leg Stand 30 seconds             Nutrition:  Nutrition Therapy & Goals - 11/26/20 0931       Nutrition Therapy   Diet TLC; carb modified    Drug/Food Interactions Statins/Certain Fruits      Personal Nutrition Goals   Nutrition Goal Pt to build a healthy plate including vegetables, fruits, whole grains, and low-fat dairy products in a heart healthy meal plan.    Personal Goal #2 Pt to eat out <2 times per week    Personal Goal #3 Pt to continue to reduce sodium to <2300 mg/day      Intervention Plan   Intervention Prescribe, educate and counsel regarding individualized specific dietary modifications aiming towards targeted core components such as weight, hypertension, lipid management, diabetes, heart failure and other comorbidities.;Nutrition handout(s) given to patient.    Expected Outcomes Long Term Goal: Adherence to prescribed nutrition plan.;Short Term Goal: Understand basic principles of dietary content, such as calories, fat, sodium, cholesterol and nutrients.             Nutrition Discharge:   Education Questionnaire Score:  Knowledge Questionnaire Score - 01/06/21 1405       Knowledge Questionnaire Score   Post Score 27/28             Goals reviewed with patient; copy given to patient.Pt graduated from cardiac rehab program today with completion of 18 exercise sessions in Phase II. Pt maintained good attendance and progressed nicely during his participation in rehab as evidenced by increased MET level.   Medication list reconciled. Repeat  PHQ score-0  .  Pt has made significant lifestyle changes and should be commended for his success. Pt feels he has achieved his goals during cardiac rehab.   Pt plans to continue exercise by going to the gym 3 days a week. Playing golf on tuesday and Thursday's and walking on the weekends. [place] increased his distance on his  post exercise walk test by 269 feet.Barnet Pall, RN,BSN 01/15/2021 3:54 PM

## 2021-01-29 DIAGNOSIS — I129 Hypertensive chronic kidney disease with stage 1 through stage 4 chronic kidney disease, or unspecified chronic kidney disease: Secondary | ICD-10-CM | POA: Diagnosis not present

## 2021-01-29 DIAGNOSIS — N182 Chronic kidney disease, stage 2 (mild): Secondary | ICD-10-CM | POA: Diagnosis not present

## 2021-01-29 DIAGNOSIS — E785 Hyperlipidemia, unspecified: Secondary | ICD-10-CM | POA: Diagnosis not present

## 2021-01-29 DIAGNOSIS — E1122 Type 2 diabetes mellitus with diabetic chronic kidney disease: Secondary | ICD-10-CM | POA: Diagnosis not present

## 2021-02-03 ENCOUNTER — Other Ambulatory Visit: Payer: Self-pay

## 2021-02-03 ENCOUNTER — Encounter: Payer: Self-pay | Admitting: Cardiology

## 2021-02-03 ENCOUNTER — Ambulatory Visit: Payer: Medicare Other | Admitting: Cardiology

## 2021-02-03 VITALS — BP 113/67 | HR 56 | Temp 98.3°F | Resp 16 | Ht 70.0 in | Wt 187.0 lb

## 2021-02-03 DIAGNOSIS — I10 Essential (primary) hypertension: Secondary | ICD-10-CM | POA: Diagnosis not present

## 2021-02-03 DIAGNOSIS — E785 Hyperlipidemia, unspecified: Secondary | ICD-10-CM | POA: Diagnosis not present

## 2021-02-03 DIAGNOSIS — N400 Enlarged prostate without lower urinary tract symptoms: Secondary | ICD-10-CM | POA: Diagnosis not present

## 2021-02-03 DIAGNOSIS — F17210 Nicotine dependence, cigarettes, uncomplicated: Secondary | ICD-10-CM | POA: Diagnosis not present

## 2021-02-03 DIAGNOSIS — I2510 Atherosclerotic heart disease of native coronary artery without angina pectoris: Secondary | ICD-10-CM

## 2021-02-03 DIAGNOSIS — F17200 Nicotine dependence, unspecified, uncomplicated: Secondary | ICD-10-CM

## 2021-02-03 DIAGNOSIS — Z0000 Encounter for general adult medical examination without abnormal findings: Secondary | ICD-10-CM | POA: Diagnosis not present

## 2021-02-03 NOTE — Progress Notes (Signed)
Patient referred by [name], MD for chest pain.   Subjective:   Adrian Baird, male    DOB: 07/23/1951, 70 y.o.   MRN: [id number]   Chief Complaint  Patient presents with   Coronary Artery Disease   Hypertension   Follow-up    HPI  70 y.o. caucasian male with hypertension, type 2 DM, CAD   Patient presented 5 days after an episode of chest pain that was suggestive of MI.  However, given his ongoing chest pain, attempted to mention to occluded RPDA, but was unsuccessful.  He has residual 80% stenosis in a small OM branch.  However, he is doing well without any angina symptoms at this time.  He [place] but did not have any chest pain or shortness of breath.  Stress test showed RCA territory infarct with only minimal peri-infarct ischemia.    Patient is compliant with medical therapy.  Blood pressure is very well controlled.  He has not had any chest pain.  He is injured increasing physical activity through cardiac rehab.  He has had 2 episodes in last 3 months of sudden shortness of breath while at night.  He denies any exertional dyspnea.  Also denies any other symptoms suggestive of heart failure, such as leg edema.    Current Outpatient Medications on File Prior to Visit  Medication Sig Dispense Refill   aspirin EC 81 MG tablet Take 1 tablet (81 mg total) by mouth daily.     Calcium Citrate (CAL-CITRATE PO) Take 500 mg by mouth daily.     famotidine (PEPCID) 40 MG tablet Take 40 mg by mouth daily.     losartan (COZAAR) 100 MG tablet Take 100 mg by mouth every morning.      metoprolol succinate (TOPROL-XL) 50 MG 24 hr tablet Take 1 tablet (50 mg total) by mouth daily. 90 tablet 3   Multiple Vitamins-Minerals (MULTIVITAMIN WITH MINERALS) tablet Take 1 tablet by mouth daily.     nitroGLYCERIN (NITROSTAT) 0.4 MG SL tablet Place 1 tablet (0.4 mg total) under the tongue every 5 (five) minutes as needed for chest pain. 90 tablet 3   rosuvastatin (CRESTOR)  10 MG tablet Take 10 mg by mouth daily.     tadalafil (CIALIS) 10 MG tablet Take 10 mg by mouth as needed for erectile dysfunction.     tamsulosin (FLOMAX) 0.4 MG CAPS Take 0.4 mg by mouth daily.     ticagrelor (BRILINTA) 90 MG TABS tablet Take 1 tablet (90 mg total) by mouth 2 (two) times daily. 180 tablet 3   No current facility-administered medications on file prior to visit.    Cardiovascular and other pertinent studies:  EKG 02/03/2021: Sinus rhythm 54 bpm First degree A-V block   PCV MYOCARDIAL PERFUSION WO LEXISCAN 09/14/2020: 1 Day Rest/Stress Protocol. Exercise nuclear stress test was performed using Bruce protocol. Patient exercised for 8 minutes 15 seconds, achieved 10.06 METS, and 84% of age predicted maximum heart rate. Stress EKG negative for ischemia. Medium size, moderate intensity, reversible perfusion defect suggestive of ischemia in the RCA distribution. Intermediate risk study, clinical correlation is suggested. No prior study available for comparison.  EKG 08/12/2020:  Sinus rhythm with first-degree AV block at a rate of 61 bpm.  Normal axis.  Inferior infarct, age indeterminate. PRWP, cannot exclude anteroseptal infarct old.   Left heart catheterization coronary angiography 08/06/2020: LM: Normal LAD: Short vessel, does not reach apexMid 40%, distal 40% stenoses LCx: Prox 30%  stenosis      Small to medium sized OM2 with diffuse disease, and focal 80% stenosis Ramus: Normnal RCA: Large vessel      Prox focal 50% stenosis      Mid 20% stenosis      Occulded RPDA with faint collaterals  filling the distal vessel   Attempted PCI to RPDA Able to wire the vessel but unable to advance further with minimal flow in spite prox PTCA  Fluoro time: 36 min Contrast used: 145 cc  Recommendations: Recommend uninterrupted dual antiplatelet therapy with Aspirin $RemoveBefo'81mg'fkYFWqQDJuj$  daily and Ticagrelor $RemoveBefore'90mg'kElDxBrQGrXbv$  twice daily. Ischemia guided revascularization could be considered to OM2, as well as  repeat attempt at [place] PCI  EKG 08/05/2020:  Sinus rhythm with first-degree AV block at a rate of 83 bpm.  Normal axis.  Inferior infarct, age indeterminate.   CT chest 10/03/2019:  Cardiovascular: Aortic atherosclerosis. Normal heart size, without pericardial effusion. Multivessel coronary artery atherosclerosis.   Recent labs: 08/05/2020: Glucose 144, BUN/Cr 19/1.00. EGFR 78. Na/K 139/4.8. Rest of the CMP normal H/H 15.5/45.0. MCV 91.5. Platelets 159 HbA1C 6.5% Chol 130, TG 117, HDL 48, LDL 61   Review of Systems  Cardiovascular:  Negative for chest pain, dyspnea on exertion, leg swelling, palpitations and syncope.  Respiratory:  Positive for shortness of breath (As described in HPI).        Vitals:   02/03/21 0942  BP: 113/67  Pulse: (!) 56  Resp: 16  Temp: 98.3 F (36.8 C)  SpO2: 98%    Body mass index is 26.83 kg/m. Filed Weights   02/03/21 0942  Weight: 187 lb (84.8 kg)    Objective:   Physical Exam Vitals and nursing note reviewed.  Constitutional:      General: He is not in acute distress. Neck:     Vascular: No JVD.  Cardiovascular:     Rate and Rhythm: Normal rate and regular rhythm.     Heart sounds: Normal heart sounds. No murmur heard. Pulmonary:     Effort: Pulmonary effort is normal.     Breath sounds: Normal breath sounds. No wheezing or rales.  Musculoskeletal:     Right lower leg: No edema.     Left lower leg: No edema.       Assessment & Recommendations:   70 y.o. caucasian male with hypertension, type 2 DM, CAD  Coronary artery disease:  No angina symptoms with minimal peri-infarct ischemia on stress test. Previous unsuccessful attempt of RPDA revascularization. Continue aggressive medical management Recommend DVT with aspirin and Brilinta till 09/2021.  Is symptoms of shortness of breath could be related to Brilinta 20 mg daily.  However, given rather infrequent symptoms, he would like to continue Brilinta.  If symptoms recur,  could switch Brilinta to Plavix.  Continue metoprolol succinate 50 mg daily. Lipids very well controlled on Crestor 10 mg.  Hypertension:  Well-controlled   Tobacco cessation counseling:  - Currently smoking 1-2 cigarettes/day.  - Patient was informed of the dangers of tobacco abuse including stroke, cancer, and MI, as well as benefits of tobacco cessation. - Patient is willing to quit at this time. - Approximately 5 mins were spent counseling patient cessation techniques. We discussed various methods to help quit smoking, including deciding on a date to quit, joining a support group, pharmacological agents.  Given the relatively low number of events spoke daily, he could use over-the-counter nicotine, for cessation. - I will reassess his progress at the next follow-up visit  F/u in  09/2021.  We will stop Brilinta at that time.    [place], PA-C 02/03/2021, 8:38 AM Office: [phone]

## 2021-05-05 DIAGNOSIS — E785 Hyperlipidemia, unspecified: Secondary | ICD-10-CM | POA: Diagnosis not present

## 2021-05-05 DIAGNOSIS — E1159 Type 2 diabetes mellitus with other circulatory complications: Secondary | ICD-10-CM | POA: Diagnosis not present

## 2021-05-05 DIAGNOSIS — I10 Essential (primary) hypertension: Secondary | ICD-10-CM | POA: Diagnosis not present

## 2021-05-05 DIAGNOSIS — I2510 Atherosclerotic heart disease of native coronary artery without angina pectoris: Secondary | ICD-10-CM | POA: Diagnosis not present

## 2021-07-26 ENCOUNTER — Encounter: Payer: Self-pay | Admitting: Urology

## 2021-07-26 ENCOUNTER — Ambulatory Visit: Payer: Medicare Other | Admitting: Urology

## 2021-07-26 ENCOUNTER — Other Ambulatory Visit: Payer: Self-pay

## 2021-07-26 VITALS — BP 129/75 | HR 71 | Wt 187.0 lb

## 2021-07-26 DIAGNOSIS — N3941 Urge incontinence: Secondary | ICD-10-CM | POA: Diagnosis not present

## 2021-07-26 DIAGNOSIS — Q5561 Curvature of penis (lateral): Secondary | ICD-10-CM | POA: Diagnosis not present

## 2021-07-26 DIAGNOSIS — N401 Enlarged prostate with lower urinary tract symptoms: Secondary | ICD-10-CM

## 2021-07-26 DIAGNOSIS — N529 Male erectile dysfunction, unspecified: Secondary | ICD-10-CM | POA: Diagnosis not present

## 2021-07-26 DIAGNOSIS — N5201 Erectile dysfunction due to arterial insufficiency: Secondary | ICD-10-CM | POA: Diagnosis not present

## 2021-07-26 DIAGNOSIS — N138 Other obstructive and reflux uropathy: Secondary | ICD-10-CM | POA: Diagnosis not present

## 2021-07-26 LAB — URINALYSIS, ROUTINE W REFLEX MICROSCOPIC
Bilirubin, UA: NEGATIVE
Ketones, UA: NEGATIVE
Leukocytes,UA: NEGATIVE
Nitrite, UA: NEGATIVE
Protein,UA: NEGATIVE
RBC, UA: NEGATIVE
Specific Gravity, UA: 1.02 (ref 1.005–1.030)
Urobilinogen, Ur: 0.2 mg/dL (ref 0.2–1.0)
pH, UA: 6 (ref 5.0–7.5)

## 2021-07-26 MED ORDER — TADALAFIL 5 MG PO TABS: 5.0000 mg | ORAL_TABLET | Freq: Every morning | ORAL | 3 refills | Status: DC

## 2021-07-26 NOTE — Progress Notes (Signed)
07/26/2021 10:56 AM   Adrian Baird [birth year] [id number]  Referring provider: [name], MD [address] [place],  [place] [address]  No chief complaint on file.   HPI:  New patient-he was seen previously in [place]-  1) BPH - on tamsulosin since 2014. He had PSA elevation to 4.65 in 2014 and underwent biopsy which was benign with a 52 g prostate.  His PSA returned to baseline and was 2.5 in 2021. AUASS = 14. On synjardy with increased LUTS and urgency.   2) ED-patient took Cialis and injections.  History of erectile dysfunction since 2011.  History of low testosterone and on testosterone replacement for short time in 2011.  He quit smoking and started exercising more recently and has been off testosterone. 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 PDE5i not as effective - took tadalafil 20 mg. Gets some AM erections.   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 Dorsal plaque noted on exam 2021.   On blood thinner - brilinta. Today, seen for the above.    PMH: Past Medical History:  Diagnosis Date   Coronary artery disease    Decreased libido    Diabetes mellitus without complication ([place])    Diverticulosis    ED (erectile dysfunction)    Elevated PSA    recently biopsied, seeing Dr. Jasmine December at [facility]   Hyperplastic colon polyp    Hypogonadism male    Kidney stones 2007   Personal history of urinary calculi    RUQ abdominal pain     Surgical History: Past Surgical History:  Procedure Laterality Date   APPENDECTOMY     CARDIAC CATHETERIZATION     CORONARY BALLOON ANGIOPLASTY N/A 08/06/2020   Procedure: CORONARY BALLOON ANGIOPLASTY;  Surgeon: Nigel Mormon, MD;  Location: [facility];  Service: Cardiovascular;  Laterality: N/A;   CYSTOSCOPY W/ URETEROSCOPY     hx of   INGUINAL HERNIA REPAIR     [place] SURGERY  2007   ?laser, possibly lithotripsy   KIDNEY STONE SURGERY  2007   ?laser, possibly  lithotripsy   LEFT HEART CATH AND CORONARY ANGIOGRAPHY N/A 08/06/2020   Procedure: LEFT HEART CATH AND CORONARY ANGIOGRAPHY;  Surgeon: Nigel Mormon, MD;  Location: [facility];  Service: Cardiovascular;  Laterality: N/A;   TONSILLECTOMY  age 22    Home Medications:  Allergies as of 07/26/2021   Not on File      Medication List        Accurate as of July 26, 2021 10:56 AM. If you have any questions, ask your nurse or doctor.          aspirin EC 81 MG tablet Take 1 tablet (81 mg total) by mouth daily.   CAL-CITRATE PO Take 500 mg by mouth daily.   famotidine 40 MG tablet Commonly known as: PEPCID Take 40 mg by mouth daily.   losartan 100 MG tablet Commonly known as: COZAAR Take 100 mg by mouth every morning.   metoprolol succinate 50 MG 24 hr tablet Commonly known as: TOPROL-XL Take 1 tablet (50 mg total) by mouth daily.   multivitamin with minerals tablet Take 1 tablet by mouth daily.   nitroGLYCERIN 0.4 MG SL tablet Commonly known as: NITROSTAT Place 1 tablet (0.4 mg total) under the tongue every 5 (five) minutes as needed for chest pain.   rosuvastatin 10 MG tablet Commonly known as: CRESTOR Take 10  mg by mouth daily.   Synjardy XR 25-1000 MG Tb24 Generic drug: Empagliflozin-metFORMIN HCl ER Take 1 tablet by mouth daily as needed.   tadalafil 10 MG tablet Commonly known as: CIALIS Take 10 mg by mouth as needed for erectile dysfunction.   tamsulosin 0.4 MG Caps capsule Commonly known as: FLOMAX Take 0.4 mg by mouth daily.   ticagrelor 90 MG Tabs tablet Commonly known as: Brilinta Take 1 tablet (90 mg total) by mouth 2 (two) times daily.        Allergies: Not on File  Family History: Family History  Problem Relation Age of Onset   Stroke Mother 7   Heart disease Mother 42   Prostate cancer Father 4   Heart disease Father 27   Hyperlipidemia Father    Stroke Sister    Breast cancer Maternal Aunt        mid-40's    Lung cancer Maternal Uncle    Prostate cancer Paternal Uncle    Prostate cancer Cousin    Colon cancer Neg Hx     Social History:  reports that he has been smoking cigarettes. He has a 5.00 pack-year smoking history. He has never used smokeless tobacco. He reports current alcohol use of about 14.0 standard drinks per week. He reports that he does not use drugs.   Physical Exam: BP 129/75    Pulse 71    Wt 187 lb (84.8 kg)    BMI 26.83 kg/m   Constitutional:  Alert and oriented, No acute distress. HEENT: [place] AT, moist mucus membranes.  Trachea midline, no masses. Cardiovascular: No clubbing, cyanosis, or edema. Respiratory: Normal respiratory effort, no increased work of breathing. GI: Abdomen is soft, nontender, nondistended, no abdominal masses GU: No CVA tenderness Lymph: No cervical or inguinal lymphadenopathy. Skin: No rashes, bruises or suspicious lesions. Neurologic: Grossly intact, no focal deficits, moving all 4 extremities. Psychiatric: Normal mood and affect. GU: GU: Penis circumcised, normal foreskin, plaque noted dorsal mid 10 mm, testicles descended bilaterally and palpably normal, bilateral epididymis palpably normal, scrotum normal DRE: Prostate 40 g, smooth without hard area or nodule   Laboratory Data: Lab Results  Component Value Date   WBC 8.1 08/06/2020   HGB 15.2 08/06/2020   HCT 42.6 08/06/2020   MCV 91.4 08/06/2020   PLT 130 (L) 08/06/2020    Lab Results  Component Value Date   CREATININE 1.01 08/06/2020    No results found for: PSA  No results found for: TESTOSTERONE  No results found for: HGBA1C  Urinalysis    Component Value Date/Time   COLORURINE YELLOW 06/11/2014 1007   APPEARANCEUR CLEAR 06/11/2014 1007   LABSPEC 1.010 06/11/2014 1007   PHURINE 7.5 06/11/2014 1007   GLUCOSEU NEGATIVE 06/11/2014 1007   HGBUR NEGATIVE 06/11/2014 1007   BILIRUBINUR NEGATIVE 06/11/2014 1007   BILIRUBINUR neg 02/27/2013 1121   KETONESUR NEGATIVE  06/11/2014 1007   PROTEINUR neg 02/27/2013 1121   UROBILINOGEN 0.2 06/11/2014 1007   NITRITE NEGATIVE 06/11/2014 1007   LEUKOCYTESUR NEGATIVE 06/11/2014 1007    No results found for: LABMICR, WBCUA, RBCUA, LABEPIT, MUCUS, BACTERIA  Pertinent Imaging/records: Reviewed [facility] notes, reviewed Dr. Shelia Media note and labs from 2022 but did not see a PSA.  Results for orders placed during the hospital encounter of 10/09/14  DG Abd 1 View  Narrative CLINICAL DATA:  History of right-sided kidney stone, for lithotripsy  EXAM: ABDOMEN - 1 VIEW  COMPARISON:  KUB of 08/12/2014 and CT abdomen pelvis of 06/13/2014  FINDINGS: The previously noted mid right renal 6 mm calculus by CT does not appear to have changed position. No additional renal or ureteral calculi are noted. The bowel gas pattern is nonspecific.  IMPRESSION: No apparent change in position of the previously demonstrated 6 mm right mid renal calculus.   Electronically Signed By: Ivar Drape M.D. On: 10/09/2014 14:36    Assessment & Plan:    BPH, wk st, urgency  - on tams. Discussed nature r/b/a to daily pde5i and he will start. Might also help with ED.   ED - daily pde5i and consider restart injections. Also discussed nature r/b/a to IPP.   Penile curvature - consider xiaflex or IPP.    No follow-ups on file.  Festus Aloe, MD  [facility]  [address] [place], [place] [address] [phone]

## 2021-07-26 NOTE — Progress Notes (Signed)
Urological Symptom Review  Patient is experiencing the following symptoms: Hard to postpone urination Get up at night to urinate Weak stream Erection problems (male only)   Review of Systems  Gastrointestinal (upper)  : Negative for upper GI symptoms  Gastrointestinal (lower) : Negative for lower GI symptoms  Constitutional : Negative for symptoms  Skin: Negative for skin symptoms  Eyes: Negative for eye symptoms  Ear/Nose/Throat : Negative for Ear/Nose/Throat symptoms  Hematologic/Lymphatic: Negative for Hematologic/Lymphatic symptoms  Cardiovascular : Negative for cardiovascular symptoms  Respiratory : Negative  Endocrine: Negative for endocrine symptoms  Musculoskeletal: Negative for musculoskeletal symptoms  Neurological: Negative for neurological symptoms  Psychologic: Negative for psychiatric symptoms

## 2021-07-27 LAB — PSA: Prostate Specific Ag, Serum: 2.4 ng/mL (ref 0.0–4.0)

## 2021-08-04 DIAGNOSIS — I10 Essential (primary) hypertension: Secondary | ICD-10-CM | POA: Diagnosis not present

## 2021-08-04 DIAGNOSIS — I2510 Atherosclerotic heart disease of native coronary artery without angina pectoris: Secondary | ICD-10-CM | POA: Diagnosis not present

## 2021-08-04 DIAGNOSIS — E785 Hyperlipidemia, unspecified: Secondary | ICD-10-CM | POA: Diagnosis not present

## 2021-08-04 DIAGNOSIS — E1159 Type 2 diabetes mellitus with other circulatory complications: Secondary | ICD-10-CM | POA: Diagnosis not present

## 2021-08-06 ENCOUNTER — Telehealth: Payer: Self-pay

## 2021-08-06 NOTE — Telephone Encounter (Signed)
[place] exception started and faxed to patient insurance for tadalafil 5mg  tablet.

## 2021-08-17 NOTE — Telephone Encounter (Signed)
PA started on covermymeds for tadalafil 5mg  daily.

## 2021-09-11 ENCOUNTER — Other Ambulatory Visit: Payer: Self-pay | Admitting: Urology

## 2021-09-11 MED ORDER — TADALAFIL 5 MG PO TABS: 5.0000 mg | ORAL_TABLET | Freq: Every morning | ORAL | 3 refills | Status: DC

## 2021-09-17 DIAGNOSIS — H5203 Hypermetropia, bilateral: Secondary | ICD-10-CM | POA: Diagnosis not present

## 2021-09-27 DIAGNOSIS — Z0100 Encounter for examination of eyes and vision without abnormal findings: Secondary | ICD-10-CM | POA: Diagnosis not present

## 2021-10-04 ENCOUNTER — Ambulatory Visit: Payer: Medicare Other | Admitting: Cardiology

## 2021-10-04 ENCOUNTER — Other Ambulatory Visit: Payer: Self-pay

## 2021-10-04 ENCOUNTER — Encounter: Payer: Self-pay | Admitting: Cardiology

## 2021-10-04 VITALS — BP 134/74 | HR 69 | Temp 97.4°F | Resp 16 | Ht 70.0 in | Wt 182.4 lb

## 2021-10-04 DIAGNOSIS — I10 Essential (primary) hypertension: Secondary | ICD-10-CM

## 2021-10-04 DIAGNOSIS — F17210 Nicotine dependence, cigarettes, uncomplicated: Secondary | ICD-10-CM | POA: Diagnosis not present

## 2021-10-04 DIAGNOSIS — I2510 Atherosclerotic heart disease of native coronary artery without angina pectoris: Secondary | ICD-10-CM

## 2021-10-04 MED ORDER — NICOTINE POLACRILEX 2 MG MT GUM: 2.0000 mg | CHEWING_GUM | OROMUCOSAL | 2 refills | Status: DC | PRN

## 2021-10-04 NOTE — Progress Notes (Signed)
Patient referred by Merri Brunette, MD for chest pain.   Subjective:   GRAYER SPROLES, male    DOB: [birth year], 71 y.o.   MRN: [id number]   Chief Complaint  Patient presents with   Coronary Artery Disease   Follow-up    1 year    HPI   71 y.o. caucasian male with hypertension, type 2 DM, CAD, nicotine dependence  Patient presented in 07/2020, 5 days after an episode of chest pain that was suggestive of MI.  However, given his ongoing chest pain, attempted to mention to occluded RPDA, but was unsuccessful.  He has residual 80% stenosis in a small OM branch.  However, he is doing well without any angina symptoms at this time.  He [place] but did not have any chest pain or shortness of breath.  Stress test showed RCA territory infarct with only minimal peri-infarct ischemia.    Patient is doing well on medical therapy.  He walks regularly, walks, exercises at gym without any symptoms of chest pain shortness of breath.  Blood pressure and lipids are very well controlled.  He still smokes 4 to 5 cigarettes daily, but is willing to quit.  Current Outpatient Medications on File Prior to Visit  Medication Sig Dispense Refill   aspirin EC 81 MG tablet Take 1 tablet (81 mg total) by mouth daily.     Calcium Citrate (CAL-CITRATE PO) Take 500 mg by mouth daily.     famotidine (PEPCID) 40 MG tablet Take 40 mg by mouth daily.     losartan (COZAAR) 100 MG tablet Take 100 mg by mouth every morning.      metoprolol succinate (TOPROL-XL) 50 MG 24 hr tablet Take 1 tablet (50 mg total) by mouth daily. 90 tablet 3   Multiple Vitamins-Minerals (MULTIVITAMIN WITH MINERALS) tablet Take 1 tablet by mouth daily.     nitroGLYCERIN (NITROSTAT) 0.4 MG SL tablet Place 1 tablet (0.4 mg total) under the tongue every 5 (five) minutes as needed for chest pain. 90 tablet 3   rosuvastatin (CRESTOR) 10 MG tablet Take 10 mg by mouth daily.     SYNJARDY XR 25-1000 MG TB24 Take 1 tablet by mouth  daily as needed.     tadalafil (CIALIS) 5 MG tablet Take 1 tablet (5 mg total) by mouth in the morning. 90 tablet 3   tamsulosin (FLOMAX) 0.4 MG CAPS Take 0.4 mg by mouth daily.     ticagrelor (BRILINTA) 90 MG TABS tablet Take 1 tablet (90 mg total) by mouth 2 (two) times daily. 180 tablet 3   No current facility-administered medications on file prior to visit.    Cardiovascular and other pertinent studies:  EKG 10/04/2021: Sinus rhythm 67 bpm First degree A-V block    PCV MYOCARDIAL PERFUSION WO LEXISCAN 09/14/2020: 1 Day Rest/Stress Protocol. Exercise nuclear stress test was performed using Bruce protocol. Patient exercised for 8 minutes 15 seconds, achieved 10.06 METS, and 84% of age predicted maximum heart rate. Stress EKG negative for ischemia. Medium size, moderate intensity, reversible perfusion defect suggestive of ischemia in the RCA distribution. Intermediate risk study, clinical correlation is suggested. No prior study available for comparison.  EKG 08/12/2020:  Sinus rhythm with first-degree AV block at a rate of 61 bpm.  Normal axis.  Inferior infarct, age indeterminate. PRWP, cannot exclude anteroseptal infarct old.   Left heart catheterization coronary angiography 08/06/2020: LM: Normal LAD: Short vessel, does not reach apexMid 40%, distal 40% stenoses LCx:  Prox 30% stenosis      Small to medium sized OM2 with diffuse disease, and focal 80% stenosis Ramus: Normnal RCA: Large vessel      Prox focal 50% stenosis      Mid 20% stenosis      Occulded RPDA with faint collaterals  filling the distal vessel   Attempted PCI to RPDA Able to wire the vessel but unable to advance further with minimal flow in spite prox PTCA  Fluoro time: 36 min Contrast used: 145 cc  Recommendations: Recommend uninterrupted dual antiplatelet therapy with Aspirin $RemoveBefo'81mg'QXeLxBXyVfF$  daily and Ticagrelor $RemoveBefore'90mg'cKDTGqKRVNyOf$  twice daily. Ischemia guided revascularization could be considered to OM2, as well as repeat attempt  at [place] PCI  EKG 08/05/2020:  Sinus rhythm with first-degree AV block at a rate of 83 bpm.  Normal axis.  Inferior infarct, age indeterminate.   CT chest 10/03/2019:  Cardiovascular: Aortic atherosclerosis. Normal heart size, without pericardial effusion. Multivessel coronary artery atherosclerosis.   Recent labs: 09/17/2021: Glucose 158, BUN/Cr 19/1.09. EGFR 80. Na/K 142/4.2. Rest of the CMP normal H/H 14/42. MCV 90. Platelets 143 HbA1C 6.6% Chol 109, TG 75, HDL 47, LDL 47 TSH NA  08/05/2020: Glucose 144, BUN/Cr 19/1.00. EGFR 78. Na/K 139/4.8. Rest of the CMP normal H/H 15.5/45.0. MCV 91.5. Platelets 159 HbA1C 6.5% Chol 130, TG 117, HDL 48, LDL 61   Review of Systems  Cardiovascular:  Negative for chest pain, dyspnea on exertion, leg swelling, palpitations and syncope.  Respiratory:  Positive for shortness of breath (As described in HPI).        Vitals:   10/04/21 0925  BP: 134/74  Pulse: 69  Resp: 16  Temp: (!) 97.4 F (36.3 C)  SpO2: 99%    Body mass index is 26.17 kg/m. Filed Weights   10/04/21 0925  Weight: 182 lb 6.4 oz (82.7 kg)    Objective:   Physical Exam Vitals and nursing note reviewed.  Constitutional:      General: He is not in acute distress. Neck:     Vascular: No JVD.  Cardiovascular:     Rate and Rhythm: Normal rate and regular rhythm.     Heart sounds: Normal heart sounds. No murmur heard. Pulmonary:     Effort: Pulmonary effort is normal.     Breath sounds: Normal breath sounds. No wheezing or rales.  Musculoskeletal:     Right lower leg: No edema.     Left lower leg: No edema.        ICD-10-CM   1. Coronary artery disease involving native coronary artery of native heart without angina pectoris  I25.10 EKG 12-Lead    2. Essential hypertension  I10     3. Cigarette nicotine dependence without complication  G53.646 nicotine polacrilex (NICORETTE) 2 MG gum     Medications Discontinued During This Encounter  Medication Reason    ticagrelor (BRILINTA) 90 MG TABS tablet Discontinued by provider    Current Outpatient Medications:    aspirin EC 81 MG tablet, Take 1 tablet (81 mg total) by mouth daily., Disp: , Rfl:    Calcium Citrate (CAL-CITRATE PO), Take 500 mg by mouth daily., Disp: , Rfl:    famotidine (PEPCID) 40 MG tablet, Take 40 mg by mouth daily., Disp: , Rfl:    losartan (COZAAR) 100 MG tablet, Take 100 mg by mouth every morning. , Disp: , Rfl:    metoprolol succinate (TOPROL-XL) 50 MG 24 hr tablet, Take 1 tablet (50 mg total) by mouth daily., Disp: 90  tablet, Rfl: 3   Multiple Vitamins-Minerals (MULTIVITAMIN WITH MINERALS) tablet, Take 1 tablet by mouth daily., Disp: , Rfl:    nicotine polacrilex (NICORETTE) 2 MG gum, Take 1 each (2 mg total) by mouth as needed for smoking cessation., Disp: 60 tablet, Rfl: 2   nitroGLYCERIN (NITROSTAT) 0.4 MG SL tablet, Place 1 tablet (0.4 mg total) under the tongue every 5 (five) minutes as needed for chest pain., Disp: 90 tablet, Rfl: 3   rosuvastatin (CRESTOR) 10 MG tablet, Take 10 mg by mouth daily., Disp: , Rfl:    SYNJARDY XR 25-1000 MG TB24, Take 1 tablet by mouth daily as needed., Disp: , Rfl:    tadalafil (CIALIS) 5 MG tablet, Take 1 tablet (5 mg total) by mouth in the morning., Disp: 90 tablet, Rfl: 3   tamsulosin (FLOMAX) 0.4 MG CAPS, Take 0.4 mg by mouth daily., Disp: , Rfl:     Assessment & Recommendations:   71 y.o. caucasian male with hypertension, type 2 DM, CAD, nicotine dependence  Coronary artery disease:  No angina symptoms with minimal peri-infarct ischemia on stress test (09/2020). Previous unsuccessful attempt of RPDA revascularization (07/2020). Continue aggressive medical management Okay to stop Brilinta now post 1 year of DAPT. Continue Aspirin 81 mg daily. Continue metoprolol succinate 50 mg daily. Lipids very well controlled on Crestor 10 mg.  Hypertension:  Well-controlled   Nicotine dependence: Tobacco cessation counseling:  -  Currently smoking 4-5 cigarettes/day.  - Patient was informed of the dangers of tobacco abuse including stroke, cancer, and MI, as well as benefits of tobacco cessation. - Patient is willing to quit at this time. - Approximately 5 mins were spent counseling patient cessation techniques. We discussed various methods to help quit smoking, including deciding on a date to quit, joining a support group, pharmacological agents.  Given the relatively low number of events spoke daily, he could use nicotine gum for cessation. - I will reassess his progress at the next follow-up visit  F/u in 1 year   [employer], PA-C 10/04/2021, 8:09 AM Office: [phone]

## 2021-10-23 ENCOUNTER — Other Ambulatory Visit: Payer: Self-pay | Admitting: Cardiology

## 2021-10-23 DIAGNOSIS — I2510 Atherosclerotic heart disease of native coronary artery without angina pectoris: Secondary | ICD-10-CM

## 2021-10-23 DIAGNOSIS — I10 Essential (primary) hypertension: Secondary | ICD-10-CM

## 2021-12-01 DIAGNOSIS — I2510 Atherosclerotic heart disease of native coronary artery without angina pectoris: Secondary | ICD-10-CM | POA: Diagnosis not present

## 2021-12-01 DIAGNOSIS — I10 Essential (primary) hypertension: Secondary | ICD-10-CM | POA: Diagnosis not present

## 2021-12-01 DIAGNOSIS — E1159 Type 2 diabetes mellitus with other circulatory complications: Secondary | ICD-10-CM | POA: Diagnosis not present

## 2021-12-01 DIAGNOSIS — E1122 Type 2 diabetes mellitus with diabetic chronic kidney disease: Secondary | ICD-10-CM | POA: Diagnosis not present

## 2021-12-01 DIAGNOSIS — E785 Hyperlipidemia, unspecified: Secondary | ICD-10-CM | POA: Diagnosis not present

## 2022-01-13 DIAGNOSIS — D2239 Melanocytic nevi of other parts of face: Secondary | ICD-10-CM | POA: Diagnosis not present

## 2022-01-13 DIAGNOSIS — L821 Other seborrheic keratosis: Secondary | ICD-10-CM | POA: Diagnosis not present

## 2022-01-13 DIAGNOSIS — D225 Melanocytic nevi of trunk: Secondary | ICD-10-CM | POA: Diagnosis not present

## 2022-01-13 DIAGNOSIS — L918 Other hypertrophic disorders of the skin: Secondary | ICD-10-CM | POA: Diagnosis not present

## 2022-02-03 ENCOUNTER — Other Ambulatory Visit (HOSPITAL_COMMUNITY): Payer: Self-pay

## 2022-02-04 DIAGNOSIS — Z125 Encounter for screening for malignant neoplasm of prostate: Secondary | ICD-10-CM | POA: Diagnosis not present

## 2022-02-04 DIAGNOSIS — I129 Hypertensive chronic kidney disease with stage 1 through stage 4 chronic kidney disease, or unspecified chronic kidney disease: Secondary | ICD-10-CM | POA: Diagnosis not present

## 2022-02-04 DIAGNOSIS — E1122 Type 2 diabetes mellitus with diabetic chronic kidney disease: Secondary | ICD-10-CM | POA: Diagnosis not present

## 2022-02-04 DIAGNOSIS — E785 Hyperlipidemia, unspecified: Secondary | ICD-10-CM | POA: Diagnosis not present

## 2022-02-09 ENCOUNTER — Other Ambulatory Visit: Payer: Self-pay | Admitting: Internal Medicine

## 2022-02-09 DIAGNOSIS — F17219 Nicotine dependence, cigarettes, with unspecified nicotine-induced disorders: Secondary | ICD-10-CM

## 2022-02-09 DIAGNOSIS — F17210 Nicotine dependence, cigarettes, uncomplicated: Secondary | ICD-10-CM | POA: Diagnosis not present

## 2022-02-09 DIAGNOSIS — Z0000 Encounter for general adult medical examination without abnormal findings: Secondary | ICD-10-CM | POA: Diagnosis not present

## 2022-02-09 DIAGNOSIS — I129 Hypertensive chronic kidney disease with stage 1 through stage 4 chronic kidney disease, or unspecified chronic kidney disease: Secondary | ICD-10-CM | POA: Diagnosis not present

## 2022-02-09 DIAGNOSIS — I2510 Atherosclerotic heart disease of native coronary artery without angina pectoris: Secondary | ICD-10-CM | POA: Diagnosis not present

## 2022-03-14 ENCOUNTER — Other Ambulatory Visit: Payer: Medicare Other

## 2022-03-16 ENCOUNTER — Ambulatory Visit
Admission: RE | Admit: 2022-03-16 | Discharge: 2022-03-16 | Disposition: A | Discharge status: Home / Self Care | Payer: Medicare Other | Source: Ambulatory Visit | Attending: Internal Medicine | Admitting: Internal Medicine

## 2022-03-16 DIAGNOSIS — I2510 Atherosclerotic heart disease of native coronary artery without angina pectoris: Secondary | ICD-10-CM | POA: Diagnosis not present

## 2022-03-16 DIAGNOSIS — J432 Centrilobular emphysema: Secondary | ICD-10-CM | POA: Diagnosis not present

## 2022-03-16 DIAGNOSIS — N200 Calculus of kidney: Secondary | ICD-10-CM | POA: Diagnosis not present

## 2022-03-16 DIAGNOSIS — F17210 Nicotine dependence, cigarettes, uncomplicated: Secondary | ICD-10-CM | POA: Diagnosis not present

## 2022-03-16 DIAGNOSIS — F17219 Nicotine dependence, cigarettes, with unspecified nicotine-induced disorders: Secondary | ICD-10-CM

## 2022-05-09 ENCOUNTER — Encounter: Payer: Self-pay | Admitting: Gastroenterology

## 2022-05-18 DIAGNOSIS — K644 Residual hemorrhoidal skin tags: Secondary | ICD-10-CM | POA: Diagnosis not present

## 2022-05-18 DIAGNOSIS — K5730 Diverticulosis of large intestine without perforation or abscess without bleeding: Secondary | ICD-10-CM | POA: Diagnosis not present

## 2022-05-18 DIAGNOSIS — K648 Other hemorrhoids: Secondary | ICD-10-CM | POA: Diagnosis not present

## 2022-05-18 DIAGNOSIS — Z1211 Encounter for screening for malignant neoplasm of colon: Secondary | ICD-10-CM | POA: Diagnosis not present

## 2022-05-18 DIAGNOSIS — K621 Rectal polyp: Secondary | ICD-10-CM | POA: Diagnosis not present

## 2022-05-18 DIAGNOSIS — K635 Polyp of colon: Secondary | ICD-10-CM | POA: Diagnosis not present

## 2022-09-16 DIAGNOSIS — H43811 Vitreous degeneration, right eye: Secondary | ICD-10-CM | POA: Diagnosis not present

## 2022-10-05 ENCOUNTER — Ambulatory Visit: Payer: Medicare Other | Admitting: Cardiology

## 2022-10-05 ENCOUNTER — Encounter: Payer: Self-pay | Admitting: Cardiology

## 2022-10-05 VITALS — BP 123/76 | HR 65 | Resp 17 | Ht 70.0 in | Wt 179.6 lb

## 2022-10-05 DIAGNOSIS — I10 Essential (primary) hypertension: Secondary | ICD-10-CM

## 2022-10-05 DIAGNOSIS — I2510 Atherosclerotic heart disease of native coronary artery without angina pectoris: Secondary | ICD-10-CM | POA: Diagnosis not present

## 2022-10-05 DIAGNOSIS — F17210 Nicotine dependence, cigarettes, uncomplicated: Secondary | ICD-10-CM

## 2022-10-05 MED ORDER — BUPROPION HCL ER (SR) 100 MG PO TB12: 100.0000 mg | ORAL_TABLET | Freq: Two times a day (BID) | ORAL | 3 refills | Status: AC

## 2022-10-05 MED ORDER — METOPROLOL SUCCINATE ER 25 MG PO TB24: 25.0000 mg | ORAL_TABLET | Freq: Every day | ORAL | 3 refills | Status: DC

## 2022-10-05 MED ORDER — ROSUVASTATIN CALCIUM 10 MG PO TABS: 10.0000 mg | ORAL_TABLET | Freq: Every day | ORAL | 3 refills | Status: DC

## 2022-10-05 NOTE — Progress Notes (Signed)
Patient referred by [name], MD for chest pain.   Subjective:   Adrian Baird, male    DOB: [birth year], 72 y.o.   MRN: [id number]   Chief Complaint  Patient presents with   Coronary Artery Disease   Follow-up    1 year     HPI   72 y.o. caucasian male with hypertension, type 2 DM, CAD, nicotine dependence  Patient is doing well, denies chest pain, shortness of breath, palpitations, leg edema, orthopnea, PND, TIA/syncope. He still smokes 10 cigarettes/day, but now states that he wants to quit.    Current Outpatient Medications:    aspirin EC 81 MG tablet, Take 1 tablet (81 mg total) by mouth daily., Disp: , Rfl:    Calcium Citrate (CAL-CITRATE PO), Take 500 mg by mouth daily., Disp: , Rfl:    famotidine (PEPCID) 40 MG tablet, Take 40 mg by mouth daily., Disp: , Rfl:    losartan (COZAAR) 100 MG tablet, Take 100 mg by mouth every morning. , Disp: , Rfl:    metoprolol succinate (TOPROL-XL) 50 MG 24 hr tablet, TAKE 1 TABLET(50 MG) BY MOUTH DAILY, Disp: 90 tablet, Rfl: 3   Multiple Vitamins-Minerals (MULTIVITAMIN WITH MINERALS) tablet, Take 1 tablet by mouth daily., Disp: , Rfl:    nitroGLYCERIN (NITROSTAT) 0.4 MG SL tablet, Place 1 tablet (0.4 mg total) under the tongue every 5 (five) minutes as needed for chest pain., Disp: 90 tablet, Rfl: 3   rosuvastatin (CRESTOR) 10 MG tablet, Take 10 mg by mouth daily., Disp: , Rfl:    SYNJARDY XR 25-1000 MG TB24, Take 1 tablet by mouth daily as needed., Disp: , Rfl:    tadalafil (CIALIS) 5 MG tablet, Take 1 tablet (5 mg total) by mouth in the morning., Disp: 90 tablet, Rfl: 3   tamsulosin (FLOMAX) 0.4 MG CAPS, Take 0.4 mg by mouth daily., Disp: , Rfl:     Cardiovascular and other pertinent studies:  EKG 10/05/2022: Sinus rhythm 62 bpm First degree A-V block    PCV MYOCARDIAL PERFUSION WO LEXISCAN 09/14/2020: 1 Day Rest/Stress Protocol. Exercise nuclear stress test was performed using Bruce protocol. Patient exercised for 8  minutes 15 seconds, achieved 10.06 METS, and 84% of age predicted maximum heart rate. Stress EKG negative for ischemia. Medium size, moderate intensity, reversible perfusion defect suggestive of ischemia in the RCA distribution. Intermediate risk study, clinical correlation is suggested. No prior study available for comparison.   Left heart catheterization coronary angiography 08/06/2020: LM: Normal LAD: Short vessel, does not reach apexMid 40%, distal 40% stenoses LCx: Prox 30% stenosis      Small to medium sized OM2 with diffuse disease, and focal 80% stenosis Ramus: Normnal RCA: Large vessel      Prox focal 50% stenosis      Mid 20% stenosis      Occulded RPDA with faint collaterals  filling the distal vessel   Attempted PCI to RPDA Able to wire the vessel but unable to advance further with minimal flow in spite prox PTCA  Fluoro time: 36 min Contrast used: 145 cc  Recommendations: Recommend uninterrupted dual antiplatelet therapy with Aspirin '81mg'$  daily and Ticagrelor '90mg'$  twice daily. Ischemia guided revascularization could be considered to OM2, as well as repeat attempt at Barton PCI   CT chest 10/03/2019:  Cardiovascular: Aortic atherosclerosis. Normal heart size, without pericardial effusion. Multivessel coronary artery atherosclerosis.   Recent labs: 4/26/02023: HbA1C 5.8%  09/17/2021: Glucose 158, BUN/Cr 19/1.09. EGFR 80. Na/K 142/4.2. Rest of  the CMP normal H/H 14/42. MCV 90. Platelets 143 HbA1C 6.6% Chol 109, TG 75, HDL 47, LDL 47 TSH NA  08/05/2020: Glucose 144, BUN/Cr 19/1.00. EGFR 78. Na/K 139/4.8. Rest of the CMP normal H/H 15.5/45.0. MCV 91.5. Platelets 159 HbA1C 6.5% Chol 130, TG 117, HDL 48, LDL 61   Review of Systems  Cardiovascular:  Negative for chest pain, dyspnea on exertion, leg swelling, palpitations and syncope.  Respiratory:  Negative for shortness of breath.         Vitals:   10/05/22 0929  BP: 123/76  Pulse: 65  Resp: 17  SpO2:  99%    Body mass index is 25.77 kg/m. Filed Weights   10/05/22 0929  Weight: 179 lb 9.6 oz (81.5 kg)    Objective:   Physical Exam Vitals and nursing note reviewed.  Constitutional:      General: He is not in acute distress. Neck:     Vascular: No JVD.  Cardiovascular:     Rate and Rhythm: Normal rate and regular rhythm.     Heart sounds: Normal heart sounds. No murmur heard. Pulmonary:     Effort: Pulmonary effort is normal.     Breath sounds: Normal breath sounds. No wheezing or rales.  Musculoskeletal:     Right lower leg: No edema.     Left lower leg: No edema.         ICD-10-CM   1. Coronary artery disease involving native coronary artery of native heart without angina pectoris  I25.10 EKG 12-Lead     Medications Discontinued During This Encounter  Medication Reason   nicotine polacrilex (NICORETTE) 2 MG gum No longer needed (for PRN medications)    Current Outpatient Medications:    aspirin EC 81 MG tablet, Take 1 tablet (81 mg total) by mouth daily., Disp: , Rfl:    Calcium Citrate (CAL-CITRATE PO), Take 500 mg by mouth daily., Disp: , Rfl:    famotidine (PEPCID) 40 MG tablet, Take 40 mg by mouth daily., Disp: , Rfl:    losartan (COZAAR) 100 MG tablet, Take 100 mg by mouth every morning. , Disp: , Rfl:    metoprolol succinate (TOPROL-XL) 50 MG 24 hr tablet, TAKE 1 TABLET(50 MG) BY MOUTH DAILY, Disp: 90 tablet, Rfl: 3   Multiple Vitamins-Minerals (MULTIVITAMIN WITH MINERALS) tablet, Take 1 tablet by mouth daily., Disp: , Rfl:    nitroGLYCERIN (NITROSTAT) 0.4 MG SL tablet, Place 1 tablet (0.4 mg total) under the tongue every 5 (five) minutes as needed for chest pain., Disp: 90 tablet, Rfl: 3   rosuvastatin (CRESTOR) 10 MG tablet, Take 10 mg by mouth daily., Disp: , Rfl:    SYNJARDY XR 25-1000 MG TB24, Take 1 tablet by mouth daily as needed., Disp: , Rfl:    tadalafil (CIALIS) 5 MG tablet, Take 1 tablet (5 mg total) by mouth in the morning., Disp: 90 tablet, Rfl:  3   tamsulosin (FLOMAX) 0.4 MG CAPS, Take 0.4 mg by mouth daily., Disp: , Rfl:     Assessment & Recommendations:   72 y.o. caucasian male with hypertension, type 2 DM, CAD, nicotine dependence  Coronary artery disease:  No angina symptoms with minimal peri-infarct ischemia on stress test (09/2020). Previous unsuccessful attempt of RPDA revascularization (07/2020). Continue aggressive medical management Continue Aspirin 81 mg daily. Reduce metoprolol succinate to 25 mg daily, as I start him on Welbutrin 100 mg bid for smoking cessation.. Lipids very well controlled on Crestor 10 mg.  Hypertension:  Well-controlled  Nicotine dependence: Tobacco cessation counseling:  - Currently smoking 10 cigarettes/day.  - Patient was informed of the dangers of tobacco abuse including stroke, cancer, and MI, as well as benefits of tobacco cessation. - Patient is willing to quit at this time. - Approximately 5 mins were spent counseling patient cessation techniques. We discussed various methods to help quit smoking, including deciding on a date to quit, joining a support group, pharmacological agents.  Given the relatively low number of events spoke daily, he could use Welbutrin, nicotine gum for cessation. - I will reassess his progress at the next follow-up visit  F/u in 1 year   [employer], PA-C 10/05/2022, 9:36 AM Office: [phone]

## 2022-10-14 DIAGNOSIS — H524 Presbyopia: Secondary | ICD-10-CM | POA: Diagnosis not present

## 2023-01-20 ENCOUNTER — Other Ambulatory Visit: Payer: Self-pay | Admitting: Internal Medicine

## 2023-01-20 DIAGNOSIS — F17200 Nicotine dependence, unspecified, uncomplicated: Secondary | ICD-10-CM

## 2023-02-15 DIAGNOSIS — Z125 Encounter for screening for malignant neoplasm of prostate: Secondary | ICD-10-CM | POA: Diagnosis not present

## 2023-02-15 DIAGNOSIS — E1122 Type 2 diabetes mellitus with diabetic chronic kidney disease: Secondary | ICD-10-CM | POA: Diagnosis not present

## 2023-02-20 DIAGNOSIS — N529 Male erectile dysfunction, unspecified: Secondary | ICD-10-CM | POA: Diagnosis not present

## 2023-02-20 DIAGNOSIS — E785 Hyperlipidemia, unspecified: Secondary | ICD-10-CM | POA: Diagnosis not present

## 2023-02-20 DIAGNOSIS — I700 Atherosclerosis of aorta: Secondary | ICD-10-CM | POA: Diagnosis not present

## 2023-02-20 DIAGNOSIS — I2510 Atherosclerotic heart disease of native coronary artery without angina pectoris: Secondary | ICD-10-CM | POA: Diagnosis not present

## 2023-02-20 DIAGNOSIS — Z0000 Encounter for general adult medical examination without abnormal findings: Secondary | ICD-10-CM | POA: Diagnosis not present

## 2023-04-04 DIAGNOSIS — Z87891 Personal history of nicotine dependence: Secondary | ICD-10-CM | POA: Diagnosis not present

## 2023-04-04 DIAGNOSIS — I10 Essential (primary) hypertension: Secondary | ICD-10-CM | POA: Diagnosis not present

## 2023-04-04 DIAGNOSIS — E785 Hyperlipidemia, unspecified: Secondary | ICD-10-CM | POA: Diagnosis not present

## 2023-04-04 DIAGNOSIS — E1159 Type 2 diabetes mellitus with other circulatory complications: Secondary | ICD-10-CM | POA: Diagnosis not present

## 2023-04-05 ENCOUNTER — Ambulatory Visit: Payer: Medicare Other | Admitting: Cardiology

## 2023-04-06 DIAGNOSIS — M25532 Pain in left wrist: Secondary | ICD-10-CM | POA: Diagnosis not present

## 2023-04-06 DIAGNOSIS — Z23 Encounter for immunization: Secondary | ICD-10-CM | POA: Diagnosis not present

## 2023-04-07 ENCOUNTER — Ambulatory Visit: Payer: Medicare Other | Admitting: Cardiology

## 2023-04-07 ENCOUNTER — Encounter: Payer: Self-pay | Admitting: Cardiology

## 2023-04-07 VITALS — BP 118/77 | HR 67 | Resp 16 | Ht 70.0 in | Wt 189.0 lb

## 2023-04-07 DIAGNOSIS — E119 Type 2 diabetes mellitus without complications: Secondary | ICD-10-CM

## 2023-04-07 DIAGNOSIS — I2510 Atherosclerotic heart disease of native coronary artery without angina pectoris: Secondary | ICD-10-CM

## 2023-04-07 MED ORDER — NITROGLYCERIN 0.4 MG SL SUBL: 0.4000 mg | SUBLINGUAL_TABLET | SUBLINGUAL | 3 refills | Status: DC | PRN

## 2023-04-07 NOTE — Progress Notes (Signed)
Patient referred by Merri Brunette, MD for chest pain.   Subjective:   Adrian Baird, male    DOB: 10-10-1950, 72 y.o.   MRN: [id number]   Chief Complaint  Patient presents with   Coronary Artery Disease   Follow-up    6 month    HPI   72 y.o. caucasian male with hypertension, type 2 DM, CAD, nicotine dependence  Since his last visit with me, he has quit smoking. He has had two episodes in past 6 months of chest pain lasting up to 5 min with more than usual physical exertion (last episode a month ago when he ran fast to take are of some situation pertaining to safety of some children). He has not taken any nitroglycerin. Reviewed recent test results with the patient, details below.     Current Outpatient Medications:    aspirin EC 81 MG tablet, Take 1 tablet (81 mg total) by mouth daily., Disp: , Rfl:    buPROPion ER (WELLBUTRIN SR) 100 MG 12 hr tablet, Take 1 tablet (100 mg total) by mouth 2 (two) times daily., Disp: 180 tablet, Rfl: 3   Calcium Citrate (CAL-CITRATE PO), Take 500 mg by mouth daily., Disp: , Rfl:    famotidine (PEPCID) 40 MG tablet, Take 40 mg by mouth daily., Disp: , Rfl:    losartan (COZAAR) 100 MG tablet, Take 100 mg by mouth every morning. , Disp: , Rfl:    metoprolol succinate (TOPROL-XL) 25 MG 24 hr tablet, Take 1 tablet (25 mg total) by mouth daily. Take with or immediately following a meal., Disp: 90 tablet, Rfl: 3   Multiple Vitamins-Minerals (MULTIVITAMIN WITH MINERALS) tablet, Take 1 tablet by mouth daily., Disp: , Rfl:    nitroGLYCERIN (NITROSTAT) 0.4 MG SL tablet, Place 1 tablet (0.4 mg total) under the tongue every 5 (five) minutes as needed for chest pain., Disp: 90 tablet, Rfl: 3   rosuvastatin (CRESTOR) 10 MG tablet, Take 1 tablet (10 mg total) by mouth daily., Disp: 90 tablet, Rfl: 3   SYNJARDY XR 25-1000 MG TB24, Take 1 tablet by mouth daily as needed., Disp: , Rfl:    tadalafil (CIALIS) 5 MG tablet, Take 1 tablet (5 mg total) by mouth in the  morning., Disp: 90 tablet, Rfl: 3   tamsulosin (FLOMAX) 0.4 MG CAPS, Take 0.4 mg by mouth daily., Disp: , Rfl:     Cardiovascular and other pertinent studies:  EKG 04/07/2023: Sinus rhythm 67 bpm First degree A-V block    PCV MYOCARDIAL PERFUSION WO LEXISCAN 09/14/2020: 1 Day Rest/Stress Protocol. Exercise nuclear stress test was performed using Bruce protocol. Patient exercised for 8 minutes 15 seconds, achieved 10.06 METS, and 84% of age predicted maximum heart rate. Stress EKG negative for ischemia. Medium size, moderate intensity, reversible perfusion defect suggestive of ischemia in the RCA distribution. Intermediate risk study, clinical correlation is suggested. No prior study available for comparison.   Left heart catheterization coronary angiography 08/06/2020: LM: Normal LAD: Short vessel, does not reach apexMid 40%, distal 40% stenoses LCx: Prox 30% stenosis      Small to medium sized OM2 with diffuse disease, and focal 80% stenosis Ramus: Normnal RCA: Large vessel      Prox focal 50% stenosis      Mid 20% stenosis      Occulded RPDA with faint collaterals  filling the distal vessel   Attempted PCI to RPDA Able to wire the vessel but unable to advance further with minimal flow in spite  prox PTCA  Fluoro time: 36 min Contrast used: 145 cc  Recommendations: Recommend uninterrupted dual antiplatelet therapy with Aspirin 81mg  daily and Ticagrelor 90mg  twice daily. Ischemia guided revascularization could be considered to OM2, as well as repeat attempt at RPDA PCI   CT chest 10/03/2019:  Cardiovascular: Aortic atherosclerosis. Normal heart size, without pericardial effusion. Multivessel coronary artery atherosclerosis.   Recent labs: 02/15/2023: eGFR 83 HbA1C 6.2% Chol 123, TG 87, HDL 53, LDL 53  4/26/02023: HbA1C 2.1%  09/17/2021: Glucose 158, BUN/Cr 19/1.09. EGFR 80. Na/K 142/4.2. Rest of the CMP normal H/H 14/42. MCV 90. Platelets 143 HbA1C 6.6% Chol 109,  TG 75, HDL 47, LDL 47 TSH NA  08/05/2020: Glucose 144, BUN/Cr 19/1.00. EGFR 78. Na/K 139/4.8. Rest of the CMP normal H/H 15.5/45.0. MCV 91.5. Platelets 159 HbA1C 6.5% Chol 130, TG 117, HDL 48, LDL 61   Review of Systems  Cardiovascular:  Negative for chest pain, dyspnea on exertion, leg swelling, palpitations and syncope.  Respiratory:  Negative for shortness of breath.         Vitals:   04/07/23 0951  BP: 118/77  Pulse: 67  Resp: 16  SpO2: 97%     Body mass index is 27.12 kg/m. Filed Weights   04/07/23 0951  Weight: 189 lb (85.7 kg)     Objective:   Physical Exam Vitals and nursing note reviewed.  Constitutional:      General: He is not in acute distress. Neck:     Vascular: No JVD.  Cardiovascular:     Rate and Rhythm: Normal rate and regular rhythm.     Heart sounds: Normal heart sounds. No murmur heard. Pulmonary:     Effort: Pulmonary effort is normal.     Breath sounds: Normal breath sounds. No wheezing or rales.  Musculoskeletal:     Right lower leg: No edema.     Left lower leg: No edema.      Visit diagnoses:    ICD-10-CM   1. Coronary artery disease involving native coronary artery of native heart without angina pectoris  I25.10 EKG 12-Lead    PCV MYOCARDIAL PERFUSION WO LEXISCAN    2. Controlled type 2 diabetes mellitus without complication, without long-term current use of insulin (HCC)  E11.9       Assessment & Recommendations:   72 y.o. caucasian male with hypertension, type 2 DM, CAD, former smoker  Coronary artery disease:  Occasional episodes of angina. Known RPDA occlusion,, severe disease in small caliber OM and moderate disease in LAD (Cath 2021). Will obtain exercise nuclear stress test, not for diagnosis of CAD< but to assess level of physical activity provoking angina symptoms. Continue Aspirin 81 mg daily, metoprolol succinate to 25 mg daily, Crestor 10 mg daily. Encouraged him top use nitroglycerin as needed for angina,  which will also give me an assessment of severity of his symptoms,  Hypertension:  Well-controlled   Nicotine dependence: Now quit smoking since starting Welbutrin. I congratulated the patient for the same.   Type 2 DM: Mild increase in A1C. Conitnue follow up with PCP/   F/u in 3 years   [employer], PA-C 04/07/2023, 9:37 AM Office: [phone]

## 2023-04-17 ENCOUNTER — Ambulatory Visit: Payer: Medicare Other

## 2023-04-17 DIAGNOSIS — I2510 Atherosclerotic heart disease of native coronary artery without angina pectoris: Secondary | ICD-10-CM

## 2023-04-18 DIAGNOSIS — M79645 Pain in left finger(s): Secondary | ICD-10-CM | POA: Diagnosis not present

## 2023-04-26 NOTE — Progress Notes (Signed)
Patient understands.

## 2023-06-08 DIAGNOSIS — R351 Nocturia: Secondary | ICD-10-CM | POA: Diagnosis not present

## 2023-06-08 DIAGNOSIS — N401 Enlarged prostate with lower urinary tract symptoms: Secondary | ICD-10-CM | POA: Diagnosis not present

## 2023-06-08 DIAGNOSIS — N5201 Erectile dysfunction due to arterial insufficiency: Secondary | ICD-10-CM | POA: Diagnosis not present

## 2023-06-21 ENCOUNTER — Encounter: Payer: Self-pay | Admitting: Gastroenterology

## 2023-06-29 ENCOUNTER — Encounter: Payer: Self-pay | Admitting: Cardiology

## 2023-06-29 ENCOUNTER — Ambulatory Visit: Payer: Medicare Other | Attending: Cardiology | Admitting: Cardiology

## 2023-06-29 DIAGNOSIS — I10 Essential (primary) hypertension: Secondary | ICD-10-CM

## 2023-06-29 DIAGNOSIS — I2510 Atherosclerotic heart disease of native coronary artery without angina pectoris: Secondary | ICD-10-CM | POA: Diagnosis not present

## 2023-06-29 MED ORDER — NITROGLYCERIN 0.4 MG SL SUBL: 0.4000 mg | SUBLINGUAL_TABLET | SUBLINGUAL | 3 refills | Status: AC | PRN

## 2023-06-29 MED ORDER — ROSUVASTATIN CALCIUM 10 MG PO TABS: 10.0000 mg | ORAL_TABLET | Freq: Every day | ORAL | 3 refills | Status: AC

## 2023-06-29 MED ORDER — LOSARTAN POTASSIUM 100 MG PO TABS: 100.0000 mg | ORAL_TABLET | Freq: Every morning | ORAL | 3 refills | Status: AC

## 2023-06-29 MED ORDER — METOPROLOL SUCCINATE ER 25 MG PO TB24: 25.0000 mg | ORAL_TABLET | Freq: Every day | ORAL | 3 refills | Status: DC

## 2023-06-29 NOTE — Patient Instructions (Signed)
 Medication Instructions:   Your physician recommends that you continue on your current medications as directed. Please refer to the Current Medication list given to you today.  *If you need a refill on your cardiac medications before your next appointment, please call your pharmacy*    Follow-Up: At [facility], you and your health needs are our priority.  As part of our continuing mission to provide you with exceptional heart care, we have created designated Provider Care Teams.  These Care Teams include your primary Cardiologist (physician) and Advanced Practice Providers (APPs -  Physician Assistants and Nurse Practitioners) who all work together to provide you with the care you need, when you need it.  We recommend signing up for the patient portal called "MyChart".  Sign up information is provided on this After Visit Summary.  MyChart is used to connect with patients for Virtual Visits (Telemedicine).  Patients are able to view lab/test results, encounter notes, upcoming appointments, etc.  Non-urgent messages can be sent to your provider as well.   To learn more about what you can do with MyChart, go to [email or web address].    Your next appointment:   1 year(s)  Provider:   DR. Rosemary Holms

## 2023-06-29 NOTE — Progress Notes (Signed)
Cardiology Office Note:  .   Date:  06/29/2023  ID:  Adrian Baird, DOB [birth year], MRN [id number] PCP: Merri Brunette, MD  [facility] Providers Cardiologist:  Truett Mainland, MD PCP: Merri Brunette, MD  Chief Complaint  Patient presents with   Coronary artery disease involving native coronary artery of   Chest Pain   Follow-up    2-3 months      History of Present Illness: .    Adrian Baird is a 72 y.o. male with hypertension, type 2 DM, CAD, former smoker  Patient is doing well.  He walks about 7-8 [place] without any complaints of chest pain or shortness of breath.  Stated complaints of chest pain at last visit have completely resolved.  Reviewed recent stress test results with the patient, details below.  On a separate note, patient has quit smoking.  Vitals:   06/29/23 1042  BP: 110/62  Pulse: 75  Resp: 16  SpO2: 97%     ROS:  Review of Systems  Cardiovascular:  Negative for chest pain, dyspnea on exertion, leg swelling, palpitations and syncope.     Studies Reviewed: Marland Kitchen       Independently interpreted Labs 02/15/2023: Chol 123, TG 87, HDL 53, LDL 53 HbA1C 6.2%  Independently interpreted and agree with the detailed read below Exercise nuclear stress test 04/17/2023: Prominent diaphragmatic attenuation artifact in the inferior wall. Mild ischemia this region cannot be excluded. Gated SPECT imaging of the left ventricle was normal. All segments of left ventricle demonstrated normal wall motion and thickening. LV rest volume 69 ml.  LV stress volume 94 ml. TID ratio 1.36, which is abnormal but visually appears normal. Stress LV EF is normal 65%.  Normal ECG stress. The patient exercised for 7 minutes and 14 seconds of a Bruce protocol, achieving approximately 8.97 METs & 92% MPHR. No symptoms. Stress terminated due to fatigue and THR achieved. The blood pressure response was normal. No previous exam available for comparison. Low  risk.   Coronary angiogram 08/06/2020: LM: Normal LAD: Short vessel, does not reach apex          Mid 40%, distal 40% stenoses LCx: Prox 30% stenosis      Small t medium sized OM2 with diffuse disease, and focal 80% stenosis Ramus: Normnal RCA: Large vessel      Prox focal 50% stenosis      Mid 20% stenosis      Occulded RPDA with faint collaterals  filling the distal vessel   Attempted PCI to RPDA Able to wire the vessel but unable to advance further with minimal flow in spite prox PTCA    Physical Exam:   Physical Exam Vitals and nursing note reviewed.  Constitutional:      General: He is not in acute distress. Neck:     Vascular: No JVD.  Cardiovascular:     Rate and Rhythm: Normal rate and regular rhythm.     Heart sounds: Normal heart sounds. No murmur heard. Pulmonary:     Effort: Pulmonary effort is normal.     Breath sounds: Normal breath sounds. No wheezing or rales.  Musculoskeletal:     Right lower leg: No edema.     Left lower leg: No edema.      VISIT DIAGNOSES:   ICD-10-CM   1. Coronary artery disease involving native coronary artery of native heart without angina pectoris  I25.10 metoprolol succinate (TOPROL-XL) 25 MG 24 hr tablet  2. Essential hypertension  I10 metoprolol succinate (TOPROL-XL) 25 MG 24 hr tablet       ASSESSMENT AND PLAN: .    Adrian Baird is a 72 y.o. male with hypertension, type 2 DM, CAD, former smoker   Coronary artery disease:  No angina symptoms. Reviewed recent stress test from 04/2023. Stated perfusion defect likely reflects known occluded RPDA.  In absence of angina, and any other perfusion defects, TID of 1.36 is nonspecific. LDL very well-controlled at 53 (02/2023). Continue current medical management, including Aspirin 81 mg daily, metoprolol succinate to 25 mg daily, Crestor 10 mg daily.  Medications refilled today. I congratulated him on continuing to refrain from smoking.  Hypertension:  Well-controlled     Type 2 DM: Controlled.      Meds ordered this encounter  Medications   rosuvastatin (CRESTOR) 10 MG tablet    Sig: Take 1 tablet (10 mg total) by mouth daily.    Dispense:  90 tablet    Refill:  3   metoprolol succinate (TOPROL-XL) 25 MG 24 hr tablet    Sig: Take 1 tablet (25 mg total) by mouth daily. Take with or immediately following a meal.    Dispense:  90 tablet    Refill:  3   losartan (COZAAR) 100 MG tablet    Sig: Take 1 tablet (100 mg total) by mouth every morning.    Dispense:  90 tablet    Refill:  3   nitroGLYCERIN (NITROSTAT) 0.4 MG SL tablet    Sig: Place 1 tablet (0.4 mg total) under the tongue every 5 (five) minutes as needed for chest pain.    Dispense:  30 tablet    Refill:  3     F/u in 1 year  Signed, Elder Negus, MD

## 2023-10-03 DIAGNOSIS — E1122 Type 2 diabetes mellitus with diabetic chronic kidney disease: Secondary | ICD-10-CM | POA: Diagnosis not present

## 2023-10-03 DIAGNOSIS — K219 Gastro-esophageal reflux disease without esophagitis: Secondary | ICD-10-CM | POA: Diagnosis not present

## 2023-10-03 DIAGNOSIS — E785 Hyperlipidemia, unspecified: Secondary | ICD-10-CM | POA: Diagnosis not present

## 2023-10-03 DIAGNOSIS — E1159 Type 2 diabetes mellitus with other circulatory complications: Secondary | ICD-10-CM | POA: Diagnosis not present

## 2023-10-16 DIAGNOSIS — H5203 Hypermetropia, bilateral: Secondary | ICD-10-CM | POA: Diagnosis not present

## 2024-02-21 DIAGNOSIS — E1122 Type 2 diabetes mellitus with diabetic chronic kidney disease: Secondary | ICD-10-CM | POA: Diagnosis not present

## 2024-02-21 DIAGNOSIS — I129 Hypertensive chronic kidney disease with stage 1 through stage 4 chronic kidney disease, or unspecified chronic kidney disease: Secondary | ICD-10-CM | POA: Diagnosis not present

## 2024-02-21 DIAGNOSIS — Z125 Encounter for screening for malignant neoplasm of prostate: Secondary | ICD-10-CM | POA: Diagnosis not present

## 2024-02-21 DIAGNOSIS — E785 Hyperlipidemia, unspecified: Secondary | ICD-10-CM | POA: Diagnosis not present

## 2024-03-06 ENCOUNTER — Other Ambulatory Visit: Payer: Self-pay | Admitting: Internal Medicine

## 2024-03-06 DIAGNOSIS — Z0000 Encounter for general adult medical examination without abnormal findings: Secondary | ICD-10-CM | POA: Diagnosis not present

## 2024-03-06 DIAGNOSIS — K219 Gastro-esophageal reflux disease without esophagitis: Secondary | ICD-10-CM | POA: Diagnosis not present

## 2024-03-06 DIAGNOSIS — E785 Hyperlipidemia, unspecified: Secondary | ICD-10-CM | POA: Diagnosis not present

## 2024-03-06 DIAGNOSIS — D649 Anemia, unspecified: Secondary | ICD-10-CM | POA: Diagnosis not present

## 2024-03-06 DIAGNOSIS — I2510 Atherosclerotic heart disease of native coronary artery without angina pectoris: Secondary | ICD-10-CM | POA: Diagnosis not present

## 2024-03-06 DIAGNOSIS — N400 Enlarged prostate without lower urinary tract symptoms: Secondary | ICD-10-CM | POA: Diagnosis not present

## 2024-03-06 DIAGNOSIS — R918 Other nonspecific abnormal finding of lung field: Secondary | ICD-10-CM

## 2024-03-07 ENCOUNTER — Encounter: Payer: Self-pay | Admitting: Internal Medicine

## 2024-03-11 ENCOUNTER — Ambulatory Visit
Admission: RE | Admit: 2024-03-11 | Discharge: 2024-03-11 | Disposition: A | Discharge status: Home / Self Care | Source: Ambulatory Visit | Attending: Internal Medicine | Admitting: Internal Medicine

## 2024-03-11 DIAGNOSIS — J439 Emphysema, unspecified: Secondary | ICD-10-CM | POA: Diagnosis not present

## 2024-03-11 DIAGNOSIS — R918 Other nonspecific abnormal finding of lung field: Secondary | ICD-10-CM

## 2024-03-13 ENCOUNTER — Encounter: Payer: Self-pay | Admitting: Gastroenterology

## 2024-04-12 DIAGNOSIS — Z23 Encounter for immunization: Secondary | ICD-10-CM | POA: Diagnosis not present

## 2024-06-03 DIAGNOSIS — N209 Urinary calculus, unspecified: Secondary | ICD-10-CM | POA: Diagnosis not present

## 2024-06-03 DIAGNOSIS — N5203 Combined arterial insufficiency and corporo-venous occlusive erectile dysfunction: Secondary | ICD-10-CM | POA: Diagnosis not present

## 2024-06-03 DIAGNOSIS — N486 Induration penis plastica: Secondary | ICD-10-CM | POA: Diagnosis not present

## 2024-06-03 DIAGNOSIS — N401 Enlarged prostate with lower urinary tract symptoms: Secondary | ICD-10-CM | POA: Diagnosis not present

## 2024-06-04 ENCOUNTER — Encounter: Payer: Self-pay | Admitting: Gastroenterology

## 2024-06-04 ENCOUNTER — Other Ambulatory Visit (INDEPENDENT_AMBULATORY_CARE_PROVIDER_SITE_OTHER)

## 2024-06-04 ENCOUNTER — Ambulatory Visit (INDEPENDENT_AMBULATORY_CARE_PROVIDER_SITE_OTHER): Admitting: Gastroenterology

## 2024-06-04 VITALS — BP 104/56 | HR 71 | Ht 71.0 in | Wt 183.4 lb

## 2024-06-04 DIAGNOSIS — D500 Iron deficiency anemia secondary to blood loss (chronic): Secondary | ICD-10-CM | POA: Diagnosis not present

## 2024-06-04 DIAGNOSIS — K7581 Nonalcoholic steatohepatitis (NASH): Secondary | ICD-10-CM | POA: Diagnosis not present

## 2024-06-04 DIAGNOSIS — I10 Essential (primary) hypertension: Secondary | ICD-10-CM | POA: Diagnosis not present

## 2024-06-04 DIAGNOSIS — D696 Thrombocytopenia, unspecified: Secondary | ICD-10-CM

## 2024-06-04 DIAGNOSIS — I2510 Atherosclerotic heart disease of native coronary artery without angina pectoris: Secondary | ICD-10-CM | POA: Diagnosis not present

## 2024-06-04 DIAGNOSIS — K7030 Alcoholic cirrhosis of liver without ascites: Secondary | ICD-10-CM | POA: Diagnosis not present

## 2024-06-04 DIAGNOSIS — E785 Hyperlipidemia, unspecified: Secondary | ICD-10-CM | POA: Diagnosis not present

## 2024-06-04 DIAGNOSIS — E1122 Type 2 diabetes mellitus with diabetic chronic kidney disease: Secondary | ICD-10-CM | POA: Diagnosis not present

## 2024-06-04 LAB — COMPREHENSIVE METABOLIC PANEL WITH GFR
ALT: 18 U/L (ref 0–53)
AST: 17 U/L (ref 0–37)
Albumin: 4.4 g/dL (ref 3.5–5.2)
Alkaline Phosphatase: 56 U/L (ref 39–117)
BUN: 21 mg/dL (ref 6–23)
CO2: 27 meq/L (ref 19–32)
Calcium: 9.4 mg/dL (ref 8.4–10.5)
Chloride: 103 meq/L (ref 96–112)
Creatinine, Ser: 1.08 mg/dL (ref 0.40–1.50)
GFR: 68.89 mL/min (ref >60.00)
Glucose, Bld: 121 mg/dL — ABNORMAL HIGH (ref 70–99)
Potassium: 4.3 meq/L (ref 3.5–5.1)
Sodium: 138 meq/L (ref 135–145)
Total Bilirubin: 0.5 mg/dL (ref 0.2–1.2)
Total Protein: 6.9 g/dL (ref 6.0–8.3)

## 2024-06-04 LAB — CBC WITH DIFFERENTIAL/PLATELET
Basophils Absolute: 0.1 K/uL (ref 0.0–0.1)
Basophils Relative: 1.5 % (ref 0.0–3.0)
Eosinophils Absolute: 0.3 K/uL (ref 0.0–0.7)
Eosinophils Relative: 3.6 % (ref 0.0–5.0)
HCT: 48.8 % (ref 39.0–52.0)
Hemoglobin: 15.9 g/dL (ref 13.0–17.0)
Lymphocytes Relative: 21.2 % (ref 12.0–46.0)
Lymphs Abs: 1.9 K/uL (ref 0.7–4.0)
MCHC: 32.6 g/dL (ref 30.0–36.0)
MCV: 81.5 fl (ref 78.0–100.0)
Monocytes Absolute: 0.6 K/uL (ref 0.1–1.0)
Monocytes Relative: 7.1 % (ref 3.0–12.0)
Neutro Abs: 5.9 K/uL (ref 1.4–7.7)
Neutrophils Relative %: 66.6 % (ref 43.0–77.0)
Platelets: 163 K/uL (ref 150.0–400.0)
RBC: 5.98 Mil/uL — ABNORMAL HIGH (ref 4.22–5.81)
RDW: 24.3 % — ABNORMAL HIGH (ref 11.5–15.5)
WBC: 8.9 K/uL (ref 4.0–10.5)

## 2024-06-04 LAB — PROTIME-INR
INR: 1.2 ratio — ABNORMAL HIGH (ref 0.8–1.0)
Prothrombin Time: 12.8 s (ref 9.6–13.1)

## 2024-06-04 LAB — IBC + FERRITIN
Ferritin: 33.1 ng/mL (ref 22.0–322.0)
Iron: 281 ug/dL — ABNORMAL HIGH (ref 42–165)
Saturation Ratios: 74.9 % — ABNORMAL HIGH (ref 20.0–50.0)
TIBC: 375.2 ug/dL (ref 250.0–450.0)
Transferrin: 268 mg/dL (ref 212.0–360.0)

## 2024-06-04 MED ORDER — NA SULFATE-K SULFATE-MG SULF 17.5-3.13-1.6 GM/177ML PO SOLN: 1.0000 | Freq: Once | ORAL | 0 refills | Status: AC

## 2024-06-04 NOTE — Patient Instructions (Addendum)
 VISIT SUMMARY:  You will be contacted by [facility] Scheduling in the next 2 days to arrange a Ultrasound.  The number on your caller ID will be [phone], please answer when they call.  If you have not heard from them in 2 days please call [phone] to schedule.    No Oral Diabetic meds the morning of procedure  Your provider has requested that you go to the basement level for lab work before leaving today. Press B on the elevator. The lab is located at the first door on the left as you exit the elevator.   During your visit, we discussed your recent low blood counts and potential gastrointestinal bleeding, your history of colonic polyps, and concerns about possible chronic liver disease related to alcohol use.  YOUR PLAN:  ANEMIA AND THROMBOCYTOPENIA: You have new onset anemia and low platelet counts, possibly due to gastrointestinal blood loss. -We will perform an upper endoscopy and colonoscopy on Monday, November 3rd at 7:30 AM to check for sources of GI bleeding. -We will do blood work to check your current blood count and liver function tests.  PERSONAL HISTORY OF COLONIC POLYPS: You have a history of colonic polyps, and it is important to monitor for new polyps or other abnormalities. -We will perform a colonoscopy as part of the evaluation for GI bleeding and polyp surveillance. -We will obtain your previous colonoscopy records for review.  SUSPECTED CHRONIC LIVER DISEASE: There is a concern about potential chronic liver disease, possibly related to your alcohol use. -We will do blood work to assess your liver function and INR. Check labs to exclude any other etiology for chronic liver disease -We will order a liver ultrasound to check for signs of cirrhosis or fatty liver. -It is important to stop drinking alcohol to prevent further liver damage.  Due to recent changes in healthcare laws, you may see the results of your imaging and laboratory studies on MyChart  before your provider has had a chance to review them.  We understand that in some cases there may be results that are confusing or concerning to you. Not all laboratory results come back in the same time frame and the provider may be waiting for multiple results in order to interpret others.  Please give us  48 hours in order for your provider to thoroughly review all the results before contacting the office for clarification of your results.    You have been scheduled for an endoscopy and colonoscopy. Please follow the written instructions given to you at your visit today.  If you use inhalers (even only as needed), please bring them with you on the day of your procedure.  DO NOT TAKE 7 DAYS PRIOR TO TEST- Trulicity (dulaglutide) Ozempic, Wegovy (semaglutide) Mounjaro (tirzepatide) Bydureon Bcise (exanatide extended release)  DO NOT TAKE 1 DAY PRIOR TO YOUR TEST Rybelsus (semaglutide) Adlyxin (lixisenatide) Victoza (liraglutide) Byetta (exanatide) ___________________________________________________________________________   I appreciate the  opportunity to care for you  Thank You   Kavitha Nandigam , MD

## 2024-06-04 NOTE — Progress Notes (Unsigned)
 Adrian Baird    [id number]    31-Aug-1950  Primary Care Physician:Pharr, Ryan, MD  Referring Physician: Clarice Ryan, MD [address] [place],  [place] [address]   Chief complaint: New onset iron deficiency anemia  Discussed the use of AI scribe software for clinical note transcription with the patient, who gave verbal consent to proceed.  History of Present Illness Adrian Baird is a 73 year old male who presents with low blood counts discovered during a routine physical exam.  Hematologic abnormalities - Low blood counts discovered during routine physical exam in June or July 2025 - Previously normal blood counts until most recent exam - No noticeable symptoms prior to identification of low blood counts  Gastrointestinal bleeding - Melena observed after initiation of iron supplementation - No melena observed prior to starting iron supplement, but stool was not regularly checked before - No other gastrointestinal symptoms reported  Colorectal cancer screening and polyp history - Colonoscopy in 2013 at [facility] with removal of two small polyps - Colonoscopy at [facility] approximately 4-5 years ago with removal of one polyp; no test results received from that procedure  Alcohol use - Alcohol consumption of one to two drinks per day - Increased frequency of alcohol use since retirement - Primarily consumes mixed drinks, occasionally wine or beer - Considers alcohol use to be moderate  Renal colic and nephrolithiasis - History of abdominal pain attributed to a small kidney stone, which was removed with resolution of pain - History of larger kidney stones, but first episode of significant discomfort from a small stone    Right upper quadrant abdominal ultrasound January 2017 1. Stable tiny 1.9 mm gallbladder polyp.  No gallstones. 2. Liver is echogenic consistent fatty infiltration and/or hepatocellular disease.  Colonoscopy April 20, 2012 by Dr. Jakie Sigmoid diverticulosis Diminutive splenic flexure polyp removed, not retrieved and small sessile rectal polyp removed, was hyperplastic  Outpatient Encounter Medications as of 06/04/2024  Medication Sig   aspirin  EC 81 MG tablet Take 1 tablet (81 mg total) by mouth daily.   buPROPion  ER (WELLBUTRIN  SR) 100 MG 12 hr tablet Take 1 tablet (100 mg total) by mouth 2 (two) times daily.   Calcium  Citrate (CAL-CITRATE PO) Take 500 mg by mouth daily.   losartan  (COZAAR ) 100 MG tablet Take 1 tablet (100 mg total) by mouth every morning.   metoprolol  succinate (TOPROL -XL) 25 MG 24 hr tablet Take 1 tablet (25 mg total) by mouth daily. Take with or immediately following a meal.   Multiple Vitamins-Minerals (MULTIVITAMIN WITH MINERALS) tablet Take 1 tablet by mouth daily.   nitroGLYCERIN  (NITROSTAT ) 0.4 MG SL tablet Place 1 tablet (0.4 mg total) under the tongue every 5 (five) minutes as needed for chest pain.   omeprazole (PRILOSEC) 20 MG capsule 1 capsule daily.   rosuvastatin  (CRESTOR ) 10 MG tablet Take 1 tablet (10 mg total) by mouth daily.   SYNJARDY XR 25-1000 MG TB24 Take 1 tablet by mouth daily as needed.   tamsulosin (FLOMAX) 0.4 MG CAPS Take 0.4 mg by mouth daily.   [DISCONTINUED] famotidine (PEPCID) 40 MG tablet Take 40 mg by mouth daily.   No facility-administered encounter medications on file as of 06/04/2024.    Allergies as of 06/04/2024   (No Known Allergies)    Past Medical History:  Diagnosis Date   Coronary artery disease    Decreased libido    Diabetes (HCC)  Diabetes mellitus without complication (HCC)    Diverticulosis    ED (erectile dysfunction)    Elevated PSA    recently biopsied, seeing Dr. Eliza at [facility] blood pressure    Hyperplastic colon polyp    Hypogonadism male    Kidney stones 2007   Personal history of urinary calculi    RUQ abdominal pain     Past Surgical History:  Procedure Laterality Date   APPENDECTOMY      CARDIAC CATHETERIZATION     CORONARY BALLOON ANGIOPLASTY N/A 08/06/2020   Procedure: CORONARY BALLOON ANGIOPLASTY;  Surgeon: Elmira Newman PARAS, MD;  Location: [facility] INVASIVE CV LAB;  Service: Cardiovascular;  Laterality: N/A;   CYSTOSCOPY W/ URETEROSCOPY     hx of   INGUINAL HERNIA REPAIR     RIH   KIDNEY STONE SURGERY  2007   ?laser, possibly lithotripsy   KIDNEY STONE SURGERY  2007   ?laser, possibly lithotripsy   LEFT HEART CATH AND CORONARY ANGIOGRAPHY N/A 08/06/2020   Procedure: LEFT HEART CATH AND CORONARY ANGIOGRAPHY;  Surgeon: Elmira Newman PARAS, MD;  Location: [facility] INVASIVE CV LAB;  Service: Cardiovascular;  Laterality: N/A;   TONSILLECTOMY  age 62    Family History  Problem Relation Age of Onset   Heart attack Mother    Stroke Mother 74   Heart disease Mother 18   Prostate cancer Father 30   Heart disease Father 15   Hyperlipidemia Father    Stroke Sister    Breast cancer Maternal Aunt        mid-40's   Lung cancer Maternal Uncle    Prostate cancer Paternal Uncle    Prostate cancer Cousin    Colon cancer Neg Hx     Social History   Socioeconomic History   Marital status: Married    Spouse name: Not on file   Number of children: 2   Years of education: Not on file   Highest education level: Associate degree: academic program  Occupational History   Occupation: Retired     Associate Professor: [employer]  Tobacco Use   Smoking status: Former    Current packs/day: 0.25    Average packs/day: 0.3 packs/day for 1.8 years (0.5 ttl pk-yrs)    Types: Cigarettes    Start date: 2024   Smokeless tobacco: Never   Tobacco comments:    Quit after Heart Attack  Vaping Use   Vaping status: Every Day  Substance and Sexual Activity   Alcohol use: Yes    Alcohol/week: 14.0 standard drinks of alcohol    Types: 7 Glasses of wine, 7 Cans of beer per week    Comment: 1-2 drinks per day   Drug use: No   Sexual activity: Yes  Other Topics Concern   Not on file  Social History  Narrative   re-married. 2 children--daughter in [facility], son in [place]   Social Drivers of Health   Financial Resource Strain: Not on file  Food Insecurity: Not on file  Transportation Needs: Not on file  Physical Activity: Not on file  Stress: Not on file  Social Connections: Not on file  Intimate Partner Violence: Not on file      Review of systems: All other review of systems negative except as mentioned in the HPI.   Physical Exam: Vitals:   06/04/24 1326  BP: (!) 104/56  Pulse: 71   Body mass index is 25.58 kg/m. Gen:      No acute distress HEENT:  sclera  anicteric CV: s1s2 rrr, no murmur Lungs: B/l clear. Abd:      soft, non-tender; no palpable masses, no distension Ext:    No edema Neuro: alert and oriented x 3 Psych: normal mood and affect  Data Reviewed:  Reviewed labs, radiology imaging, old records and pertinent past GI work up   Assessment and Plan Assessment & Plan Anemia and thrombocytopenia, evaluation for gastrointestinal blood loss New onset anemia and thrombocytopenia since last physical in June/July. Possible gastrointestinal blood loss suspected due to black stools, potentially related to iron supplementation. Occult blood loss from the GI tract is a common cause of anemia. - Order upper endoscopy and colonoscopy to evaluate for sources of GI bleeding. - Schedule procedures for Monday, November 3rd at 7:30 AM. - Perform blood work to check current blood count and iron level  Personal history of colonic polyps, surveillance Colonic polyps with previous removals. Last colonoscopy was 4-5 years ago with polyp removal, but no follow-up results were communicated. Surveillance is necessary to monitor for new polyps or other abnormalities. - Perform colonoscopy as part of the evaluation for GI bleeding and polyp surveillance. - Obtain previous colonoscopy records for review.  Suspected chronic liver disease (possible cirrhosis or fatty liver) related to  alcohol use Suspected chronic liver disease potentially related to long-term alcohol use. Concerns about fatty liver or cirrhosis due to history of alcohol consumption and declining platelet count, which may indicate portal hypertension. Discussed risks of alcohol consumption exacerbating liver damage and potential complications of cirrhosis such as ascites, varices, and hepatic encephalopathy. Recommended cessation of alcohol consumption to prevent further liver damage. - Perform blood work to assess liver function and INR. - Order liver ultrasound to evaluate for signs of cirrhosis or fatty liver. - Advise cessation of alcohol consumption to prevent further liver damage.  Return in 3 months    This visit required 60 minutes of patient care (this includes precharting, chart review, review of results, face-to-face time used for counseling as well as treatment plan and follow-up. The patient was provided an opportunity to ask questions and all were answered. The patient agreed with the plan and demonstrated an understanding of the instructions.  LOIS Wilkie Mcgee , MD    CC: Clarice Nottingham, MD

## 2024-06-05 ENCOUNTER — Encounter: Payer: Self-pay | Admitting: Gastroenterology

## 2024-06-05 ENCOUNTER — Ambulatory Visit: Payer: Self-pay | Admitting: Gastroenterology

## 2024-06-06 LAB — HEPATITIS C ANTIBODY: Hepatitis C Ab: NONREACTIVE

## 2024-06-06 LAB — HEPATITIS B CORE ANTIBODY, TOTAL: Hep B Core Total Ab: NONREACTIVE

## 2024-06-06 LAB — HEPATITIS B SURFACE ANTIGEN: Hepatitis B Surface Ag: NONREACTIVE

## 2024-06-06 LAB — HEPATITIS B SURFACE ANTIBODY,QUALITATIVE: Hep B S Ab: NONREACTIVE

## 2024-06-06 LAB — AFP TUMOR MARKER: AFP-Tumor Marker: 3.5 ng/mL (ref <6.1)

## 2024-06-06 LAB — ANA: Anti Nuclear Antibody (ANA): NEGATIVE

## 2024-06-13 ENCOUNTER — Telehealth: Payer: Self-pay | Admitting: *Deleted

## 2024-06-13 ENCOUNTER — Encounter: Payer: Self-pay | Admitting: *Deleted

## 2024-06-13 NOTE — Telephone Encounter (Signed)
 Okay, please let me know once you contact patient.  I can reach out to the charge nurse to check if that is okay

## 2024-06-13 NOTE — Telephone Encounter (Signed)
 Dr Shila, I put this patient on for a double at 2pm on 11/18. Not realizing it was only one spot there. It let me schedule in the blank spot below it.   I already didn't put him on the schedule for 11/17 at 7:30 to start with. Is there anyway this double can work in this spot? If not I can move him again. Sorry

## 2024-06-13 NOTE — Telephone Encounter (Signed)
 Spoke with patient and they could not do the 18 th. I rescheduled them to 07/29/2024 at 9 am  Patient was very friendly

## 2024-06-13 NOTE — Telephone Encounter (Signed)
 Heron HERO.  brought it to my attention that she had paperwork on a patient that had not been scheduled yet. I seemed to have done all the paperwork but forgot to add him to Dr Sherren [facility]  schedule. I have moved him to 11/18 the next day at 2 pm. I had to leave a message of new date and time and he can call me back with questions. Will do new instructions on My Chart.

## 2024-06-17 ENCOUNTER — Ambulatory Visit (HOSPITAL_COMMUNITY)
Admission: RE | Admit: 2024-06-17 | Discharge: 2024-06-17 | Disposition: A | Discharge status: Home / Self Care | Source: Ambulatory Visit | Attending: Gastroenterology | Admitting: Gastroenterology

## 2024-06-17 DIAGNOSIS — D500 Iron deficiency anemia secondary to blood loss (chronic): Secondary | ICD-10-CM | POA: Diagnosis not present

## 2024-06-17 DIAGNOSIS — K7030 Alcoholic cirrhosis of liver without ascites: Secondary | ICD-10-CM | POA: Diagnosis present

## 2024-06-17 DIAGNOSIS — K7581 Nonalcoholic steatohepatitis (NASH): Secondary | ICD-10-CM | POA: Diagnosis not present

## 2024-06-17 DIAGNOSIS — K7460 Unspecified cirrhosis of liver: Secondary | ICD-10-CM | POA: Diagnosis not present

## 2024-06-17 DIAGNOSIS — K824 Cholesterolosis of gallbladder: Secondary | ICD-10-CM | POA: Diagnosis not present

## 2024-06-17 DIAGNOSIS — D696 Thrombocytopenia, unspecified: Secondary | ICD-10-CM | POA: Diagnosis not present

## 2024-06-17 DIAGNOSIS — K7689 Other specified diseases of liver: Secondary | ICD-10-CM | POA: Diagnosis not present

## 2024-06-25 ENCOUNTER — Encounter: Admitting: Gastroenterology

## 2024-07-12 DIAGNOSIS — N486 Induration penis plastica: Secondary | ICD-10-CM | POA: Diagnosis not present

## 2024-07-22 ENCOUNTER — Other Ambulatory Visit: Payer: Self-pay | Admitting: Medical Genetics

## 2024-07-29 ENCOUNTER — Ambulatory Visit: Admitting: Gastroenterology

## 2024-07-29 ENCOUNTER — Encounter: Payer: Self-pay | Admitting: Gastroenterology

## 2024-07-29 VITALS — BP 116/60 | HR 73 | Temp 98.5°F | Resp 15 | Ht 71.0 in | Wt 183.0 lb

## 2024-07-29 DIAGNOSIS — K7030 Alcoholic cirrhosis of liver without ascites: Secondary | ICD-10-CM

## 2024-07-29 DIAGNOSIS — K635 Polyp of colon: Secondary | ICD-10-CM

## 2024-07-29 DIAGNOSIS — K449 Diaphragmatic hernia without obstruction or gangrene: Secondary | ICD-10-CM

## 2024-07-29 DIAGNOSIS — I8500 Esophageal varices without bleeding: Secondary | ICD-10-CM

## 2024-07-29 DIAGNOSIS — K648 Other hemorrhoids: Secondary | ICD-10-CM | POA: Diagnosis not present

## 2024-07-29 DIAGNOSIS — Z1211 Encounter for screening for malignant neoplasm of colon: Secondary | ICD-10-CM | POA: Diagnosis present

## 2024-07-29 DIAGNOSIS — Z860101 Personal history of adenomatous and serrated colon polyps: Secondary | ICD-10-CM

## 2024-07-29 DIAGNOSIS — K7581 Nonalcoholic steatohepatitis (NASH): Secondary | ICD-10-CM

## 2024-07-29 DIAGNOSIS — K644 Residual hemorrhoidal skin tags: Secondary | ICD-10-CM | POA: Diagnosis not present

## 2024-07-29 DIAGNOSIS — K5730 Diverticulosis of large intestine without perforation or abscess without bleeding: Secondary | ICD-10-CM | POA: Diagnosis not present

## 2024-07-29 DIAGNOSIS — Z87442 Personal history of urinary calculi: Secondary | ICD-10-CM | POA: Insufficient documentation

## 2024-07-29 DIAGNOSIS — D500 Iron deficiency anemia secondary to blood loss (chronic): Secondary | ICD-10-CM

## 2024-07-29 DIAGNOSIS — D122 Benign neoplasm of ascending colon: Secondary | ICD-10-CM

## 2024-07-29 DIAGNOSIS — I219 Acute myocardial infarction, unspecified: Secondary | ICD-10-CM | POA: Insufficient documentation

## 2024-07-29 DIAGNOSIS — I8510 Secondary esophageal varices without bleeding: Secondary | ICD-10-CM

## 2024-07-29 MED ORDER — SODIUM CHLORIDE 0.9 % IV SOLN: 500.0000 mL | INTRAVENOUS | Status: DC

## 2024-07-29 MED ORDER — CARVEDILOL 3.125 MG PO TABS: 3.1250 mg | ORAL_TABLET | Freq: Two times a day (BID) | ORAL | 3 refills | Status: AC

## 2024-07-29 NOTE — Progress Notes (Signed)
 Called to room to assist during endoscopic procedure.  Patient ID and intended procedure confirmed with present staff. Received instructions for my participation in the procedure from the performing physician.

## 2024-07-29 NOTE — Progress Notes (Signed)
 Pt's states no medical or surgical changes since previsit or office visit.   Per pt he drank a sip of water at 0730. MD and CRNA made aware.

## 2024-07-29 NOTE — Patient Instructions (Addendum)
 Resume previous diet.                           - Continue present medications.                           - No ibuprofen , naproxen, or other non-steroidal                            anti-inflammatory drugs.                           - Continue to avoid alcohol and sugar intake                           - Carvidelol 3.125mg  twice daily, Rx for 90 days                            with 3 refills for esophageal varices prophylaxis                           - Follow up in GI office for cirrhosis with                            Dr.Nandigam or [facility] APP in 3 months  Repeat colonoscopy in 5-10 years for surveillance                            based on pathology results.   YOU HAD AN ENDOSCOPIC PROCEDURE TODAY AT THE [facility]:   Refer to the procedure report that was given to you for any specific questions about what was found during the examination.  If the procedure report does not answer your questions, please call your gastroenterologist to clarify.  If you requested that your care partner not be given the details of your procedure findings, then the procedure report has been included in a sealed envelope for you to review at your convenience later.  YOU SHOULD EXPECT: Some feelings of bloating in the abdomen. Passage of more gas than usual.  Walking can help get rid of the air that was put into your GI tract during the procedure and reduce the bloating. If you had a lower endoscopy (such as a colonoscopy or flexible sigmoidoscopy) you may notice spotting of blood in your stool or on the toilet paper. If you underwent a bowel prep for your procedure, you may not have a normal bowel movement for a few days.  Please Note:  You might notice some irritation and congestion in your nose or some drainage.  This is from the oxygen used during your procedure.  There is no need for concern and it should clear up in a day or so.  SYMPTOMS TO REPORT IMMEDIATELY:  Following lower endoscopy  (colonoscopy or flexible sigmoidoscopy):  Excessive amounts of blood in the stool  Significant tenderness or worsening of abdominal pains  Swelling of the abdomen that is new, acute  Fever of 100F or higher  Following upper endoscopy (EGD)  Vomiting of blood or coffee ground material  New chest pain or pain under the shoulder blades  Painful or persistently difficult  swallowing  New shortness of breath  Fever of 100F or higher  Black, tarry-looking stools  For urgent or emergent issues, a gastroenterologist can be reached at any hour by calling [phone]. Do not use MyChart messaging for urgent concerns.    DIET:  We do recommend a small meal at first, but then you may proceed to your regular diet.  Drink plenty of fluids but you should avoid alcoholic beverages for 24 hours.  ACTIVITY:  You should plan to take it easy for the rest of today and you should NOT DRIVE or use heavy machinery until tomorrow (because of the sedation medicines used during the test).    FOLLOW UP: Our staff will call the number listed on your records the next business day following your procedure.  We will call around 7:15- 8:00 am to check on you and address any questions or concerns that you may have regarding the information given to you following your procedure. If we do not reach you, we will leave a message.     If any biopsies were taken you will be contacted by phone or by letter within the next 1-3 weeks.  Please call us  at [phone] if you have not heard about the biopsies in 3 weeks.    SIGNATURES/CONFIDENTIALITY: You and/or your care partner have signed paperwork which will be entered into your electronic medical record.  These signatures attest to the fact that that the information above on your After Visit Summary has been reviewed and is understood.  Full responsibility of the confidentiality of this discharge information lies with you and/or your care-partner.

## 2024-07-29 NOTE — Op Note (Signed)
 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 The patient's tolerance of                            previous anesthesia was also reviewed. The risks                            and benefits of the procedure and the sedation                            options and risks were discussed with the patient.                            All questions were answered, and informed consent                            was obtained. Prior Anticoagulants: The patient has                            taken no anticoagulant or antiplatelet agents. ASA                            Grade Assessment: III - A patient with severe                            systemic disease. After reviewing the risks and                            benefits, the patient was deemed in satisfactory                            condition to undergo the procedure.                           After obtaining informed consent, the endoscope was  passed under direct vision. Throughout the                            procedure, the patient's blood pressure, pulse, and                            oxygen saturations were monitored continuously. The                            Olympus Scope SN [id number] was introduced through the                            mouth, and advanced to the second part of duodenum.                             The upper GI endoscopy was accomplished without                            difficulty. The patient tolerated the procedure                            well. Scope In: Scope Out: Findings:                 Grade I varices were found in the entire esophagus.                            They were less than 5 mm in largest diameter.                           A 2 cm hiatal hernia was present.                           The stomach was normal.                           The cardia and gastric fundus were normal on                            retroflexion.                           The examined duodenum was normal. Complications:            No immediate complications. Estimated Blood Loss:     Estimated blood loss was minimal. Impression:               - Grade I esophageal varices.                           - 2 cm hiatal hernia.                           - Normal stomach.                           - Normal examined duodenum.                           -  No specimens collected. Recommendation:           - Resume previous diet.                           - Continue present medications.                           - No ibuprofen , naproxen, or other non-steroidal                            anti-inflammatory drugs.                           - Continue to avoid alcohol and sugar intake                           - Carvidelol 3.125mg  twice daily, Rx for 90 days                            with 3 refills for esophageal varices prophylaxis                           - Follow up in GI office for cirrhosis with                            Dr.Celeste Tavenner or [facility] APP in 3 months Gustav ALONSO Mcgee, MD 07/29/2024 10:36:30 AM This report has been signed electronically.

## 2024-07-29 NOTE — Op Note (Signed)
 [facility] Patient Name: Adrian Baird Procedure Date: 07/29/2024 9:41 AM MRN: [id number] Endoscopist: Gustav ALONSO Mcgee , MD, [id number] Age: 73 Referring MD:  Date of Birth: 02/25/1951 Gender: Male Account #: [id number] Procedure:                Colonoscopy Indications:              High risk colon cancer surveillance: Personal                            history of colonic polyps, High risk colon cancer                            surveillance: Personal history of adenoma less than                            10 mm in size Medicines:                Monitored Anesthesia Care Procedure:                Pre-Anesthesia Assessment:                           - Prior to the procedure, a History and Physical                            was performed, and patient medications and                            allergies were reviewed. The patient's tolerance of                            previous anesthesia was also reviewed. The risks                            and benefits of the procedure and the sedation                            options and risks were discussed with the patient.                            All questions were answered, and informed consent                            was obtained. Prior Anticoagulants: The patient has                            taken no anticoagulant or antiplatelet agents. ASA                            Grade Assessment: III - A patient with severe                            systemic disease. After reviewing the risks and  benefits, the patient was deemed in satisfactory                            condition to undergo the procedure.                           After obtaining informed consent, the colonoscope                            was passed under direct vision. Throughout the                            procedure, the patient's blood pressure, pulse, and                            oxygen saturations were monitored  continuously. The                            Colonoscope was introduced through the anus and                            advanced to the the cecum, identified by                            appendiceal orifice and ileocecal valve. The                            colonoscopy was performed without difficulty. The                            patient tolerated the procedure well. The quality                            of the bowel preparation was good. The ileocecal                            valve, appendiceal orifice, and rectum were                            photographed. Scope In: 10:04:27 AM Scope Out: 10:20:52 AM Scope Withdrawal Time: 0 hours 11 minutes 54 seconds  Total Procedure Duration: 0 hours 16 minutes 25 seconds  Findings:                 The perianal and digital rectal examinations were                            normal.                           A 5 mm polyp was found in the ascending colon. The                            polyp was sessile. The polyp was removed with a  cold snare. Resection and retrieval were complete.                           Scattered large-mouthed, medium-mouthed and                            small-mouthed diverticula were found in the sigmoid                            colon, descending colon, transverse colon and                            ascending colon.                           Non-bleeding external and internal hemorrhoids were                            found during retroflexion. The hemorrhoids were                            medium-sized. Complications:            No immediate complications. Estimated Blood Loss:     Estimated blood loss was minimal. Impression:               - One 5 mm polyp in the ascending colon, removed                            with a cold snare. Resected and retrieved.                           - Moderate diverticulosis in the sigmoid colon, in                            the descending colon, in  the transverse colon and                            in the ascending colon.                           - Non-bleeding external and internal hemorrhoids. Recommendation:           - Resume previous diet.                           - Continue present medications.                           - Await pathology results.                           - Repeat colonoscopy in 5-10 years for surveillance                            based on pathology results. Adrian Knutzen V. Henri Baumler, MD 07/29/2024 10:31:58 AM This report has been signed electronically.

## 2024-07-29 NOTE — Progress Notes (Signed)
 Transferred to PACU via stretcher.  Not responding to stimulation at this time.  VSS upon leaving procedure room.

## 2024-07-29 NOTE — Progress Notes (Signed)
 [facility] History and Physical   Primary Care Physician:  Clarice Nottingham, MD   Reason for Procedure:   esophageal varices screening, history of colon polyps  Plan:    EGD and colonoscopy with possible interventions as needed     HPI: Adrian Baird is a very pleasant 73 y.o. male here for EGD and colonoscopy for  esophageal varices screening and h/o colon polyps.  Please see office note for details  The risks and benefits as well as alternatives of endoscopic procedure(s) have been discussed and reviewed.  The patient was provided an opportunity to ask questions and all were answered. The patient agreed with the plan and demonstrated an understanding of the instructions.   Past Medical History:  Diagnosis Date   Coronary artery disease    Decreased libido    Diabetes (HCC)    Diabetes mellitus without complication (HCC)    Diverticulosis    ED (erectile dysfunction)    Elevated PSA    recently biopsied, seeing Dr. Eliza at [facility] blood pressure    Hyperplastic colon polyp    Hypogonadism male    Kidney stones 2007   Myocardial infarction [facility])    Personal history of urinary calculi    RUQ abdominal pain     Past Surgical History:  Procedure Laterality Date   APPENDECTOMY     CARDIAC CATHETERIZATION     CORONARY BALLOON ANGIOPLASTY N/A 08/06/2020   Procedure: CORONARY BALLOON ANGIOPLASTY;  Surgeon: Elmira Newman PARAS, MD;  Location: [facility];  Service: Cardiovascular;  Laterality: N/A;   CYSTOSCOPY W/ URETEROSCOPY     hx of   INGUINAL HERNIA REPAIR     RIH   KIDNEY STONE SURGERY  2007   ?laser, possibly lithotripsy   KIDNEY STONE SURGERY  2007   ?laser, possibly lithotripsy   LEFT HEART CATH AND CORONARY ANGIOGRAPHY N/A 08/06/2020   Procedure: LEFT HEART CATH AND CORONARY ANGIOGRAPHY;  Surgeon: Elmira Newman PARAS, MD;  Location: [facility];  Service: Cardiovascular;  Laterality: N/A;   TONSILLECTOMY  age 30     Prior to Admission medications  Medication Sig Start Date End Date Taking? Authorizing Provider  aspirin  EC 81 MG tablet Take 1 tablet (81 mg total) by mouth daily. 10/13/14  Yes Nieves Cough, MD  buPROPion  ER (WELLBUTRIN  SR) 100 MG 12 hr tablet Take 1 tablet (100 mg total) by mouth 2 (two) times daily. 10/05/22  Yes Patwardhan, Manish J, MD  Calcium  Citrate (CAL-CITRATE PO) Take 500 mg by mouth daily.   Yes [provider]  losartan  (COZAAR ) 100 MG tablet Take 1 tablet (100 mg total) by mouth every morning. 06/29/23  Yes Patwardhan, Newman PARAS, MD  metoprolol  succinate (TOPROL -XL) 25 MG 24 hr tablet Take 1 tablet (25 mg total) by mouth daily. Take with or immediately following a meal. 06/29/23  Yes Patwardhan, Manish J, MD  Multiple Vitamins-Minerals (MULTIVITAMIN WITH MINERALS) tablet Take 1 tablet by mouth daily.   Yes [provider]  omeprazole (PRILOSEC) 20 MG capsule 1 capsule daily. 12/07/23  Yes [provider]  rosuvastatin  (CRESTOR ) 10 MG tablet Take 1 tablet (10 mg total) by mouth daily. 06/29/23  Yes Patwardhan, Manish J, MD  SYNJARDY XR 25-1000 MG TB24 Take 1 tablet by mouth daily as needed. 07/05/21  Yes [provider]  tamsulosin (FLOMAX) 0.4 MG CAPS Take 0.4 mg by mouth daily. 02/06/13  Yes [provider]  nitroGLYCERIN  (NITROSTAT ) 0.4 MG SL tablet  Place 1 tablet (0.4 mg total) under the tongue every 5 (five) minutes as needed for chest pain. 06/29/23 06/04/24  Patwardhan, Manish J, MD    Current Outpatient Medications  Medication Sig Dispense Refill   aspirin  EC 81 MG tablet Take 1 tablet (81 mg total) by mouth daily.     buPROPion  ER (WELLBUTRIN  SR) 100 MG 12 hr tablet Take 1 tablet (100 mg total) by mouth 2 (two) times daily. 180 tablet 3   Calcium  Citrate (CAL-CITRATE PO) Take 500 mg by mouth daily.     losartan  (COZAAR ) 100 MG tablet Take 1 tablet (100 mg total) by mouth every morning. 90 tablet 3   metoprolol  succinate  (TOPROL -XL) 25 MG 24 hr tablet Take 1 tablet (25 mg total) by mouth daily. Take with or immediately following a meal. 90 tablet 3   Multiple Vitamins-Minerals (MULTIVITAMIN WITH MINERALS) tablet Take 1 tablet by mouth daily.     omeprazole (PRILOSEC) 20 MG capsule 1 capsule daily.     rosuvastatin  (CRESTOR ) 10 MG tablet Take 1 tablet (10 mg total) by mouth daily. 90 tablet 3   SYNJARDY XR 25-1000 MG TB24 Take 1 tablet by mouth daily as needed.     tamsulosin (FLOMAX) 0.4 MG CAPS Take 0.4 mg by mouth daily.     nitroGLYCERIN  (NITROSTAT ) 0.4 MG SL tablet Place 1 tablet (0.4 mg total) under the tongue every 5 (five) minutes as needed for chest pain. 30 tablet 3   Current Facility-Administered Medications  Medication Dose Route Frequency Provider Last Rate Last Admin   0.9 %  sodium chloride  infusion  500 mL Intravenous Continuous Naiyah Klostermann V, MD        Allergies as of 07/29/2024   (No Known Allergies)    Family History  Problem Relation Age of Onset   Heart attack Mother    Stroke Mother 9   Heart disease Mother 36   Prostate cancer Father 7   Heart disease Father 26   Hyperlipidemia Father    Stroke Sister    Breast cancer Maternal Aunt        mid-40's   Lung cancer Maternal Uncle    Prostate cancer Paternal Uncle    Prostate cancer Cousin    Colon cancer Neg Hx    Stomach cancer Neg Hx    Esophageal cancer Neg Hx    Rectal cancer Neg Hx     Social History   Socioeconomic History   Marital status: Married    Spouse name: Not on file   Number of children: 2   Years of education: Not on file   Highest education level: Associate degree: academic program  Occupational History   Occupation: Retired     Associate Professor: [employer]  Tobacco Use   Smoking status: Every Day    Current packs/day: 0.25    Average packs/day: 0.3 packs/day for 2.0 years (0.5 ttl pk-yrs)    Types: Cigarettes    Start date: 2024   Smokeless tobacco: Never   Tobacco comments:    Quit after  Heart Attack  Vaping Use   Vaping status: Every Day  Substance and Sexual Activity   Alcohol use: Yes    Alcohol/week: 14.0 standard drinks of alcohol    Types: 7 Glasses of wine, 7 Cans of beer per week    Comment: 1-2 drinks per day   Drug use: No   Sexual activity: Yes  Other Topics Concern   Not on file  Social History Narrative  re-married. 2 children--daughter in [facility], son in [place]   Social Drivers of Health   Tobacco Use: High Risk (07/29/2024)   Patient History    Smoking Tobacco Use: Every Day    Smokeless Tobacco Use: Never    Passive Exposure: Not on file  Financial Resource Strain: Not on file  Food Insecurity: Not on file  Transportation Needs: Not on file  Physical Activity: Not on file  Stress: Not on file  Social Connections: Not on file  Intimate Partner Violence: Not on file  Depression (EYV7-0): Not on file  Alcohol Screen: Not on file  Housing: Not on file  Utilities: Not on file  Health Literacy: Not on file    Review of Systems:  All other review of systems negative except as mentioned in the HPI.  Physical Exam: Vital signs in last 24 hours: BP 130/68   Pulse 67   Temp 98.5 F (36.9 C)   Ht 5' 11 (1.803 m)   Wt 183 lb (83 kg)   SpO2 98%   BMI 25.52 kg/m  General:   Alert, NAD Lungs:  Clear .   Heart:  Regular rate and rhythm Abdomen:  Soft, nontender and nondistended. Neuro/Psych:  Alert and cooperative. Normal mood and affect. A and O x 3  Reviewed labs, radiology imaging, old records and pertinent past GI work up  Patient is appropriate for planned procedure(s) and anesthesia in an ambulatory setting   K. Veena Param Capri , MD [phone]

## 2024-07-30 ENCOUNTER — Telehealth: Payer: Self-pay

## 2024-07-30 NOTE — Telephone Encounter (Signed)
" °  Follow up Call-     07/29/2024    9:12 AM  Call back number  Post procedure Call Back phone  # [phone]  Permission to leave phone message Yes     Patient questions:  Do you have a fever, pain , or abdominal swelling? No. Pain Score  0 *  Have you tolerated food without any problems? Yes.    Have you been able to return to your normal activities? Yes.    Do you have any questions about your discharge instructions: Diet   No. Medications  No. Follow up visit  No.  Do you have questions or concerns about your Care? No.  Actions: * If pain score is 4 or above: No action needed, pain <4.   "

## 2024-08-05 LAB — SURGICAL PATHOLOGY

## 2024-08-12 ENCOUNTER — Other Ambulatory Visit: Payer: Self-pay

## 2024-08-12 DIAGNOSIS — I2510 Atherosclerotic heart disease of native coronary artery without angina pectoris: Secondary | ICD-10-CM

## 2024-08-12 DIAGNOSIS — I10 Essential (primary) hypertension: Secondary | ICD-10-CM

## 2024-08-14 MED ORDER — METOPROLOL SUCCINATE ER 25 MG PO TB24: 25.0000 mg | ORAL_TABLET | Freq: Every day | ORAL | 0 refills | Status: AC

## 2024-08-19 ENCOUNTER — Other Ambulatory Visit

## 2024-08-19 DIAGNOSIS — Z006 Encounter for examination for normal comparison and control in clinical research program: Secondary | ICD-10-CM

## 2024-08-19 LAB — GENECONNECT MOLECULAR SCREEN

## 2024-08-27 NOTE — Addendum Note (Signed)
 Addended by: DELPHINE BRUNO HERO on: 08/27/2024 12:43 PM   Modules accepted: Orders

## 2024-09-05 ENCOUNTER — Ambulatory Visit: Payer: Self-pay | Admitting: Gastroenterology

## 2024-09-11 LAB — GENECONNECT MOLECULAR SCREEN: Genetic Analysis Overall Interpretation: NEGATIVE

## 2024-09-24 ENCOUNTER — Ambulatory Visit: Admitting: Gastroenterology
